# Patient Record
Sex: Female | Born: 1995 | Race: Black or African American | Hispanic: Yes | Marital: Single | Smoking: Never smoker
Health system: Southern US, Community
[De-identification: ages and names within clinical notes are randomized; demographics above are authoritative.]

## PROBLEM LIST (undated history)

## (undated) ENCOUNTER — Inpatient Hospital Stay (HOSPITAL_COMMUNITY): Payer: Self-pay

## (undated) DIAGNOSIS — Z789 Other specified health status: Secondary | ICD-10-CM

## (undated) HISTORY — PX: NO PAST SURGERIES: SHX2092

---

## 2017-01-21 ENCOUNTER — Inpatient Hospital Stay (HOSPITAL_COMMUNITY)
Admission: AD | Admit: 2017-01-21 | Discharge: 2017-01-21 | Disposition: A | Discharge status: Home / Self Care | Payer: Managed Care, Other (non HMO) | Source: Ambulatory Visit | Attending: Obstetrics and Gynecology | Admitting: Obstetrics and Gynecology

## 2017-01-21 ENCOUNTER — Encounter (HOSPITAL_COMMUNITY): Payer: Self-pay | Admitting: *Deleted

## 2017-01-21 DIAGNOSIS — Z3201 Encounter for pregnancy test, result positive: Secondary | ICD-10-CM | POA: Diagnosis not present

## 2017-01-21 DIAGNOSIS — Z3A09 9 weeks gestation of pregnancy: Secondary | ICD-10-CM | POA: Insufficient documentation

## 2017-01-21 DIAGNOSIS — O219 Vomiting of pregnancy, unspecified: Secondary | ICD-10-CM | POA: Diagnosis not present

## 2017-01-21 DIAGNOSIS — Z8249 Family history of ischemic heart disease and other diseases of the circulatory system: Secondary | ICD-10-CM | POA: Diagnosis not present

## 2017-01-21 HISTORY — DX: Other specified health status: Z78.9

## 2017-01-21 LAB — COMPREHENSIVE METABOLIC PANEL
ALBUMIN: 4 g/dL (ref 3.5–5.0)
ALK PHOS: 56 U/L (ref 38–126)
ALT: 19 U/L (ref 14–54)
AST: 21 U/L (ref 15–41)
Anion gap: 8 (ref 5–15)
BILIRUBIN TOTAL: 0.5 mg/dL (ref 0.3–1.2)
BUN: 11 mg/dL (ref 6–20)
CALCIUM: 9.9 mg/dL (ref 8.9–10.3)
CO2: 23 mmol/L (ref 22–32)
CREATININE: 0.44 mg/dL (ref 0.44–1.00)
Chloride: 104 mmol/L (ref 101–111)
GFR calc Af Amer: >60 mL/min (ref >60)
GLUCOSE: 83 mg/dL (ref 65–99)
POTASSIUM: 4.7 mmol/L (ref 3.5–5.1)
Sodium: 135 mmol/L (ref 135–145)
Total Protein: 7.7 g/dL (ref 6.5–8.1)

## 2017-01-21 LAB — CBC
HEMATOCRIT: 37 % (ref 36.0–46.0)
HEMOGLOBIN: 12.8 g/dL (ref 12.0–15.0)
MCH: 30.3 pg (ref 26.0–34.0)
MCHC: 34.6 g/dL (ref 30.0–36.0)
MCV: 87.5 fL (ref 78.0–100.0)
Platelets: 310 10*3/uL (ref 150–400)
RBC: 4.23 MIL/uL (ref 3.87–5.11)
RDW: 14.9 % (ref 11.5–15.5)
WBC: 12.3 10*3/uL — AB (ref 4.0–10.5)

## 2017-01-21 LAB — URINALYSIS, ROUTINE W REFLEX MICROSCOPIC
Bacteria, UA: NONE SEEN
Bilirubin Urine: NEGATIVE
Glucose, UA: NEGATIVE mg/dL
HGB URINE DIPSTICK: NEGATIVE
Ketones, ur: 20 mg/dL — AB
LEUKOCYTES UA: NEGATIVE
NITRITE: NEGATIVE
PH: 5 (ref 5.0–8.0)
Protein, ur: 30 mg/dL — AB
SPECIFIC GRAVITY, URINE: 1.027 (ref 1.005–1.030)

## 2017-01-21 LAB — POCT PREGNANCY, URINE: Preg Test, Ur: POSITIVE — AB

## 2017-01-21 MED ORDER — PROMETHAZINE HCL 25 MG PO TABS: 25.0000 mg | ORAL_TABLET | Freq: Four times a day (QID) | ORAL | 0 refills | Status: DC | PRN

## 2017-01-21 MED ORDER — LACTATED RINGERS IV BOLUS (SEPSIS)
1000.0000 mL | Freq: Once | INTRAVENOUS | Status: AC
  Administered 2017-01-21: 1000 mL via INTRAVENOUS

## 2017-01-21 MED ORDER — PROMETHAZINE HCL 25 MG/ML IJ SOLN
25.0000 mg | Freq: Once | INTRAMUSCULAR | Status: AC
  Administered 2017-01-21: 25 mg via INTRAVENOUS
  Filled 2017-01-21: qty 1

## 2017-01-21 MED ORDER — DOXYLAMINE-PYRIDOXINE 10-10 MG PO TBEC: 2.0000 | DELAYED_RELEASE_TABLET | Freq: Every evening | ORAL | 1 refills | Status: DC | PRN

## 2017-01-21 NOTE — MAU Note (Signed)
Pt states she has been having headaches and n/v on and off, but couldn't keep anything down yesterday.  Pt had been taking Diclegis that was left over from a previous pregnancy, but ran out of it and symptoms have gotten worse since. Denies pain.

## 2017-01-21 NOTE — MAU Provider Note (Signed)
History     CSN: [id number]  Arrival date and time: 01/21/17 1310   First Provider Initiated Contact with Patient 01/21/17 1445     Chief Complaint  Patient presents with  . Emesis  . Nausea   HPI Shelby Kerr is a 22 y.o. G3P0020 at 8036w1d who presents with nausea and vomiting. She states she has had nausea throughout the pregnancy and has been using diclegis from a previous pregnancy to help. She ran out last week and states the nausea and vomiting has gotten worse. Denies leaking or bleeding. Denies pain. She has not been seen anywhere during this pregnancy yet.   OB History    Gravida Para Term Preterm AB Living   3       2     SAB TAB Ectopic Multiple Live Births   1 1            Past Medical History:  Diagnosis Date  . Medical history non-contributory     Past Surgical History:  Procedure Laterality Date  . NO PAST SURGERIES      Family History  Problem Relation Age of Onset  . Hypertension Mother     Social History   Tobacco Use  . Smoking status: Never Smoker  . Smokeless tobacco: Never Used  Substance Use Topics  . Alcohol use: No    Frequency: Never  . Drug use: No    Allergies: No Known Allergies  No medications prior to admission.    Review of Systems  Constitutional: Negative.  Negative for fatigue and fever.  HENT: Negative.   Respiratory: Negative.  Negative for shortness of breath.   Cardiovascular: Negative.  Negative for chest pain.  Gastrointestinal: Positive for nausea and vomiting. Negative for abdominal pain, constipation and diarrhea.  Genitourinary: Negative.  Negative for dysuria, vaginal bleeding and vaginal discharge.  Neurological: Negative.  Negative for dizziness and headaches.   Physical Exam   Blood pressure 122/81, pulse (!) 108, temperature 98.5 F (36.9 C), temperature source Oral, resp. rate 16, height 5\' 3"  (1.6 m), weight 114 lb (51.7 kg), last menstrual period 11/18/2016.  Physical Exam  Nursing note and  vitals reviewed. Constitutional: She is oriented to person, place, and time. She appears well-developed and well-nourished. No distress.  HENT:  Head: Normocephalic.  Eyes: Pupils are equal, round, and reactive to light.  Cardiovascular: Normal rate, regular rhythm and normal heart sounds.  Respiratory: Effort normal and breath sounds normal. No respiratory distress.  GI: Soft. Bowel sounds are normal. She exhibits no distension. There is no tenderness.  Neurological: She is alert and oriented to person, place, and time.  Skin: Skin is warm and dry.  Psychiatric: She has a normal mood and affect. Her behavior is normal. Judgment and thought content normal.    MAU Course  Procedures Results for orders placed or performed during the hospital encounter of 01/21/17 (from the past 24 hour(s))  Urinalysis, Routine w reflex microscopic     Status: Abnormal   Collection Time: 01/21/17  2:00 PM  Result Value Ref Range   Color, Urine AMBER (A) YELLOW   APPearance HAZY (A) CLEAR   Specific Gravity, Urine 1.027 1.005 - 1.030   pH 5.0 5.0 - 8.0   Glucose, UA NEGATIVE NEGATIVE mg/dL   Hgb urine dipstick NEGATIVE NEGATIVE   Bilirubin Urine NEGATIVE NEGATIVE   Ketones, ur 20 (A) NEGATIVE mg/dL   Protein, ur 30 (A) NEGATIVE mg/dL   Nitrite NEGATIVE NEGATIVE   Leukocytes, UA  NEGATIVE NEGATIVE   RBC / HPF 0-5 0 - 5 RBC/hpf   WBC, UA 0-5 0 - 5 WBC/hpf   Bacteria, UA NONE SEEN NONE SEEN   Squamous Epithelial / LPF 6-30 (A) NONE SEEN   Mucus PRESENT   Pregnancy, urine POC     Status: Abnormal   Collection Time: 01/21/17  2:08 PM  Result Value Ref Range   Preg Test, Ur POSITIVE (A) NEGATIVE   MDM UA IV bolus CBC, CMP Phenergan IV No episodes of vomiting while in MAU, patient reports relief from nausea  Assessment and Plan   1. Nausea and vomiting during pregnancy prior to [redacted] weeks gestation    -Discharge home in stable condition -Rx for phenergan and diclegis sent to patient's  pharmacy -Patient advised to follow-up with OB/GYN of choice to start prenatal care asap -Patient may return to MAU as needed or if her condition were to change or worsen   Rolm Bookbinder CNM 01/21/2017, 2:48 PM

## 2017-01-21 NOTE — Discharge Instructions (Signed)
Morning Sickness °Morning sickness is when you feel sick to your stomach (nauseous) during pregnancy. This nauseous feeling may or may not come with vomiting. It often occurs in the morning but can be a problem any time of day. Morning sickness is most common during the first trimester, but it may continue throughout pregnancy. While morning sickness is unpleasant, it is usually harmless unless you develop severe and continual vomiting (hyperemesis gravidarum). This condition requires more intense treatment. °What are the causes? °The cause of morning sickness is not completely known but seems to be related to normal hormonal changes that occur in pregnancy. °What increases the risk? °You are at greater risk if you: °· Experienced nausea or vomiting before your pregnancy. °· Had morning sickness during a previous pregnancy. °· Are pregnant with more than one baby, such as twins. ° °How is this treated? °Do not use any medicines (prescription, over-the-counter, or herbal) for morning sickness without first talking to your health care provider. Your health care provider may prescribe or recommend: °· Vitamin B6 supplements. °· Anti-nausea medicines. °· The herbal medicine ginger. ° °Follow these instructions at home: °· Only take over-the-counter or prescription medicines as directed by your health care provider. °· Taking multivitamins before getting pregnant can prevent or decrease the severity of morning sickness in most women. °· Eat a piece of dry toast or unsalted crackers before getting out of bed in the morning. °· Eat five or six small meals a day. °· Eat dry and bland foods (rice, baked potato). Foods high in carbohydrates are often helpful. °· Do not drink liquids with your meals. Drink liquids between meals. °· Avoid greasy, fatty, and spicy foods. °· Get someone to cook for you if the smell of any food causes nausea and vomiting. °· If you feel nauseous after taking prenatal vitamins, take the vitamins at  night or with a snack. °· Snack on protein foods (nuts, yogurt, cheese) between meals if you are hungry. °· Eat unsweetened gelatins for desserts. °· Wearing an acupressure wristband (worn for sea sickness) may be helpful. °· Acupuncture may be helpful. °· Do not smoke. °· Get a humidifier to keep the air in your house free of odors. °· Get plenty of fresh air. °Contact a health care provider if: °· Your home remedies are not working, and you need medicine. °· You feel dizzy or lightheaded. °· You are losing weight. °Get help right away if: °· You have persistent and uncontrolled nausea and vomiting. °· You pass out (faint). °This information is not intended to replace advice given to you by your health care provider. Make sure you discuss any questions you have with your health care provider. °Document Released: 02/08/2006 Document Revised: 05/26/2015 Document Reviewed: 06/04/2012 °Elsevier Interactive Patient Education © 2017 [employer] °Safe Medications in Pregnancy  ° °Acne: °Benzoyl Peroxide °Salicylic Acid ° °Backache/Headache: °Tylenol: 2 regular strength every 4 hours OR °             2 Extra strength every 6 hours ° °Colds/Coughs/Allergies: °Benadryl (alcohol free) 25 mg every 6 hours as needed °Breath right strips °Claritin °Cepacol throat lozenges °Chloraseptic throat spray °Cold-Eeze- up to three times per day °Cough drops, alcohol free °Flonase (by prescription only) °Guaifenesin °Mucinex °Robitussin DM (plain only, alcohol free) °Saline nasal spray/drops °Sudafed (pseudoephedrine) & Actifed ** use only after [redacted] weeks gestation and if you do not have high blood pressure °Tylenol °Vicks Vaporub °Zinc lozenges °Zyrtec  ° °Constipation: °Colace °Ducolax suppositories °Fleet enema °  Glycerin suppositories °Metamucil °Milk of magnesia °Miralax °Senokot °Smooth move tea ° °Diarrhea: °Kaopectate °Imodium A-D ° °*NO pepto Bismol ° °Hemorrhoids: °Anusol °Anusol HC °Preparation  H °Tucks ° °Indigestion: °Tums °Maalox °Mylanta °Zantac  °Pepcid ° °Insomnia: °Benadryl (alcohol free) 25mg every 6 hours as needed °Tylenol PM °Unisom, no Gelcaps ° °Leg Cramps: °Tums °MagGel ° °Nausea/Vomiting:  °Bonine °Dramamine °Emetrol °Ginger extract °Sea bands °Meclizine  °Nausea medication to take during pregnancy:  °Unisom (doxylamine succinate 25 mg tablets) Take one tablet daily at bedtime. If symptoms are not adequately controlled, the dose can be increased to a maximum recommended dose of two tablets daily (1/2 tablet in the morning, 1/2 tablet mid-afternoon and one at bedtime). °Vitamin B6 100mg tablets. Take one tablet twice a day (up to 200 mg per day). ° °Skin Rashes: °Aveeno products °Benadryl cream or 25mg every 6 hours as needed °Calamine Lotion °1% cortisone cream ° °Yeast infection: °Gyne-lotrimin 7 °Monistat 7 ° ° °**If taking multiple medications, please check labels to avoid duplicating the same active ingredients °**take medication as directed on the label °** Do not exceed 4000 mg of tylenol in 24 hours °**Do not take medications that contain aspirin or ibuprofen ° ° ° ° °

## 2017-08-19 ENCOUNTER — Inpatient Hospital Stay (HOSPITAL_COMMUNITY): Payer: Managed Care, Other (non HMO) | Admitting: Anesthesiology

## 2017-08-19 ENCOUNTER — Other Ambulatory Visit: Payer: Self-pay

## 2017-08-19 ENCOUNTER — Inpatient Hospital Stay (HOSPITAL_COMMUNITY)
Admission: AD | Admit: 2017-08-19 | Discharge: 2017-08-21 | DRG: 806 | Disposition: A | Discharge status: Home / Self Care | Payer: Managed Care, Other (non HMO) | Attending: Obstetrics and Gynecology | Admitting: Obstetrics and Gynecology

## 2017-08-19 ENCOUNTER — Encounter (HOSPITAL_COMMUNITY): Payer: Self-pay | Admitting: *Deleted

## 2017-08-19 DIAGNOSIS — Z3A39 39 weeks gestation of pregnancy: Secondary | ICD-10-CM | POA: Diagnosis not present

## 2017-08-19 DIAGNOSIS — O99824 Streptococcus B carrier state complicating childbirth: Secondary | ICD-10-CM | POA: Diagnosis present

## 2017-08-19 DIAGNOSIS — Z3483 Encounter for supervision of other normal pregnancy, third trimester: Secondary | ICD-10-CM | POA: Diagnosis present

## 2017-08-19 LAB — RPR: RPR Ser Ql: NONREACTIVE

## 2017-08-19 LAB — CBC
HEMATOCRIT: 36.5 % (ref 36.0–46.0)
HEMOGLOBIN: 12 g/dL (ref 12.0–15.0)
MCH: 27.5 pg (ref 26.0–34.0)
MCHC: 32.9 g/dL (ref 30.0–36.0)
MCV: 83.5 fL (ref 78.0–100.0)
PLATELETS: 192 10*3/uL (ref 150–400)
RBC: 4.37 MIL/uL (ref 3.87–5.11)
RDW: 16.1 % — ABNORMAL HIGH (ref 11.5–15.5)
WBC: 16.7 10*3/uL — AB (ref 4.0–10.5)

## 2017-08-19 LAB — TYPE AND SCREEN
ABO/RH(D): O POS
ANTIBODY SCREEN: NEGATIVE

## 2017-08-19 LAB — ABO/RH: ABO/RH(D): O POS

## 2017-08-19 MED ORDER — SIMETHICONE 80 MG PO CHEW: 80.0000 mg | CHEWABLE_TABLET | ORAL | Status: DC | PRN

## 2017-08-19 MED ORDER — OXYTOCIN 40 UNITS IN LACTATED RINGERS INFUSION - SIMPLE MED: 2.5000 [IU]/h | INTRAVENOUS | Status: DC

## 2017-08-19 MED ORDER — PHENYLEPHRINE 40 MCG/ML (10ML) SYRINGE FOR IV PUSH (FOR BLOOD PRESSURE SUPPORT)
80.0000 ug | PREFILLED_SYRINGE | INTRAVENOUS | Status: DC | PRN
  Filled 2017-08-19: qty 5
  Filled 2017-08-19: qty 10

## 2017-08-19 MED ORDER — ONDANSETRON HCL 4 MG/2ML IJ SOLN: 4.0000 mg | INTRAMUSCULAR | Status: DC | PRN

## 2017-08-19 MED ORDER — LACTATED RINGERS IV SOLN
500.0000 mL | Freq: Once | INTRAVENOUS | Status: AC
  Administered 2017-08-19: 500 mL via INTRAVENOUS

## 2017-08-19 MED ORDER — LIDOCAINE HCL (PF) 1 % IJ SOLN
INTRAMUSCULAR | Status: DC | PRN
  Administered 2017-08-19: 8 mL via EPIDURAL

## 2017-08-19 MED ORDER — SOD CITRATE-CITRIC ACID 500-334 MG/5ML PO SOLN: 30.0000 mL | ORAL | Status: DC | PRN

## 2017-08-19 MED ORDER — OXYTOCIN BOLUS FROM INFUSION
500.0000 mL | Freq: Once | INTRAVENOUS | Status: AC
  Administered 2017-08-19: 500 mL via INTRAVENOUS

## 2017-08-19 MED ORDER — SODIUM CHLORIDE 0.9 % IV SOLN
5.0000 10*6.[IU] | Freq: Once | INTRAVENOUS | Status: AC
  Administered 2017-08-19: 5 10*6.[IU] via INTRAVENOUS
  Filled 2017-08-19: qty 5

## 2017-08-19 MED ORDER — DIPHENHYDRAMINE HCL 50 MG/ML IJ SOLN: 12.5000 mg | INTRAMUSCULAR | Status: DC | PRN

## 2017-08-19 MED ORDER — ZOLPIDEM TARTRATE 5 MG PO TABS
5.0000 mg | ORAL_TABLET | Freq: Every evening | ORAL | Status: DC | PRN
Start: 2017-08-19 — End: 2017-08-21

## 2017-08-19 MED ORDER — ACETAMINOPHEN 325 MG PO TABS
650.0000 mg | ORAL_TABLET | ORAL | Status: DC | PRN
  Administered 2017-08-19: 650 mg via ORAL
  Filled 2017-08-19: qty 2

## 2017-08-19 MED ORDER — SENNOSIDES-DOCUSATE SODIUM 8.6-50 MG PO TABS
2.0000 | ORAL_TABLET | ORAL | Status: DC
  Administered 2017-08-20 (×2): 2 via ORAL
  Filled 2017-08-19 (×2): qty 2

## 2017-08-19 MED ORDER — DIBUCAINE 1 % RE OINT: 1.0000 "application " | TOPICAL_OINTMENT | RECTAL | Status: DC | PRN

## 2017-08-19 MED ORDER — EPHEDRINE 5 MG/ML INJ
10.0000 mg | INTRAVENOUS | Status: DC | PRN
  Filled 2017-08-19: qty 2

## 2017-08-19 MED ORDER — LACTATED RINGERS IV SOLN: 500.0000 mL | INTRAVENOUS | Status: DC | PRN

## 2017-08-19 MED ORDER — LACTATED RINGERS IV SOLN
INTRAVENOUS | Status: DC
  Administered 2017-08-19 (×2): via INTRAVENOUS

## 2017-08-19 MED ORDER — DIPHENHYDRAMINE HCL 25 MG PO CAPS: 25.0000 mg | ORAL_CAPSULE | Freq: Four times a day (QID) | ORAL | Status: DC | PRN

## 2017-08-19 MED ORDER — OXYCODONE HCL 5 MG PO TABS: 5.0000 mg | ORAL_TABLET | ORAL | Status: DC | PRN

## 2017-08-19 MED ORDER — TETANUS-DIPHTH-ACELL PERTUSSIS 5-2.5-18.5 LF-MCG/0.5 IM SUSP: 0.5000 mL | Freq: Once | INTRAMUSCULAR | Status: DC

## 2017-08-19 MED ORDER — ACETAMINOPHEN 325 MG PO TABS: 650.0000 mg | ORAL_TABLET | ORAL | Status: DC | PRN

## 2017-08-19 MED ORDER — OXYTOCIN 40 UNITS IN LACTATED RINGERS INFUSION - SIMPLE MED
1.0000 m[IU]/min | INTRAVENOUS | Status: DC
  Administered 2017-08-19: 2 m[IU]/min via INTRAVENOUS
  Filled 2017-08-19: qty 1000

## 2017-08-19 MED ORDER — FENTANYL 2.5 MCG/ML BUPIVACAINE 1/10 % EPIDURAL INFUSION (WH - ANES)
14.0000 mL/h | INTRAMUSCULAR | Status: DC | PRN
  Administered 2017-08-19 (×2): 14 mL/h via EPIDURAL
  Filled 2017-08-19 (×2): qty 100

## 2017-08-19 MED ORDER — OXYCODONE HCL 5 MG PO TABS: 10.0000 mg | ORAL_TABLET | ORAL | Status: DC | PRN

## 2017-08-19 MED ORDER — OXYCODONE-ACETAMINOPHEN 5-325 MG PO TABS: 1.0000 | ORAL_TABLET | ORAL | Status: DC | PRN

## 2017-08-19 MED ORDER — PENICILLIN G 3 MILLION UNITS IVPB - SIMPLE MED
3.0000 10*6.[IU] | INTRAVENOUS | Status: DC
  Administered 2017-08-19 (×2): 3 10*6.[IU] via INTRAVENOUS
  Filled 2017-08-19 (×2): qty 3
  Filled 2017-08-19: qty 100

## 2017-08-19 MED ORDER — LIDOCAINE HCL (PF) 1 % IJ SOLN
30.0000 mL | INTRAMUSCULAR | Status: DC | PRN
  Filled 2017-08-19: qty 30

## 2017-08-19 MED ORDER — ONDANSETRON HCL 4 MG PO TABS
4.0000 mg | ORAL_TABLET | ORAL | Status: DC | PRN
Start: 2017-08-19 — End: 2017-08-21

## 2017-08-19 MED ORDER — BENZOCAINE-MENTHOL 20-0.5 % EX AERO
1.0000 "application " | INHALATION_SPRAY | CUTANEOUS | Status: DC | PRN
  Administered 2017-08-19: 1 via TOPICAL
  Filled 2017-08-19: qty 56

## 2017-08-19 MED ORDER — PHENYLEPHRINE 40 MCG/ML (10ML) SYRINGE FOR IV PUSH (FOR BLOOD PRESSURE SUPPORT)
80.0000 ug | PREFILLED_SYRINGE | INTRAVENOUS | Status: DC | PRN
  Filled 2017-08-19: qty 5

## 2017-08-19 MED ORDER — WITCH HAZEL-GLYCERIN EX PADS: 1.0000 "application " | MEDICATED_PAD | CUTANEOUS | Status: DC | PRN

## 2017-08-19 MED ORDER — COCONUT OIL OIL
1.0000 "application " | TOPICAL_OIL | Status: DC | PRN
  Administered 2017-08-20: 1 via TOPICAL
  Filled 2017-08-19: qty 120

## 2017-08-19 MED ORDER — IBUPROFEN 600 MG PO TABS
600.0000 mg | ORAL_TABLET | Freq: Four times a day (QID) | ORAL | Status: DC
  Administered 2017-08-19 – 2017-08-21 (×8): 600 mg via ORAL
  Filled 2017-08-19 (×7): qty 1

## 2017-08-19 MED ORDER — FLEET ENEMA 7-19 GM/118ML RE ENEM: 1.0000 | ENEMA | RECTAL | Status: DC | PRN

## 2017-08-19 MED ORDER — OXYCODONE-ACETAMINOPHEN 5-325 MG PO TABS: 2.0000 | ORAL_TABLET | ORAL | Status: DC | PRN

## 2017-08-19 MED ORDER — PRENATAL MULTIVITAMIN CH
1.0000 | ORAL_TABLET | Freq: Every day | ORAL | Status: DC
  Administered 2017-08-20 – 2017-08-21 (×2): 1 via ORAL
  Filled 2017-08-19 (×2): qty 1

## 2017-08-19 MED ORDER — ONDANSETRON HCL 4 MG/2ML IJ SOLN
4.0000 mg | Freq: Four times a day (QID) | INTRAMUSCULAR | Status: DC | PRN
  Administered 2017-08-19: 4 mg via INTRAVENOUS
  Filled 2017-08-19: qty 2

## 2017-08-19 MED ORDER — TERBUTALINE SULFATE 1 MG/ML IJ SOLN
0.2500 mg | Freq: Once | INTRAMUSCULAR | Status: DC | PRN
  Filled 2017-08-19: qty 1

## 2017-08-19 NOTE — Anesthesia Postprocedure Evaluation (Signed)
Anesthesia Post Note  Patient: Suzie Leugers  Procedure(s) Performed: AN AD HOC LABOR EPIDURAL     Patient location during evaluation: Mother Baby Anesthesia Type: Epidural Level of consciousness: awake and alert Pain management: pain level controlled Vital Signs Assessment: post-procedure vital signs reviewed and stable Respiratory status: spontaneous breathing, nonlabored ventilation and respiratory function stable Cardiovascular status: stable Postop Assessment: no headache, no backache, epidural receding, no apparent nausea or vomiting, patient able to bend at knees, adequate PO intake and able to ambulate Anesthetic complications: no    Last Vitals:  Vitals:   08/19/17 1201 08/19/17 1234  BP: 102/70 118/72  Pulse: 76 74  Resp: 16 20  Temp:  36.9 C  SpO2:      Last Pain:  Vitals:   08/19/17 1235  TempSrc:   PainSc: 0-No pain   Pain Goal: Patients Stated Pain Goal: 0 (08/19/17 0400)               Kyen Taite Hristova

## 2017-08-19 NOTE — MAU Note (Signed)
Pt reports UC's less than 5 minutes apart since 2300. Some bleeding after intercourse today.

## 2017-08-19 NOTE — Anesthesia Procedure Notes (Signed)
Epidural Patient location during procedure: OB Start time: 08/19/2017 2:00 AM End time: 08/19/2017 2:10 AM  Staffing Anesthesiologist: Bethena Midgetddono, Ellenie Salome, MD  Preanesthetic Checklist Completed: patient identified, site marked, surgical consent, pre-op evaluation, timeout performed, IV checked, risks and benefits discussed and monitors and equipment checked  Epidural Patient position: sitting Prep: site prepped and draped and DuraPrep Patient monitoring: continuous pulse ox and blood pressure Approach: midline Location: L4-L5 Injection technique: LOR air  Needle:  Needle type: Tuohy  Needle gauge: 17 G Needle length: 9 cm and 9 Needle insertion depth: 6 cm Catheter type: closed end flexible Catheter size: 19 Gauge Catheter at skin depth: 11 cm Test dose: negative  Assessment Events: blood not aspirated, injection not painful, no injection resistance, negative IV test and no paresthesia

## 2017-08-19 NOTE — H&P (Signed)
Shelby Kerr is a 22 y.o. female presenting for contractions. OB History    Gravida  3   Para      Term      Preterm      AB  2   Living        SAB  1   TAB  1   Ectopic      Multiple      Live Births             Past Medical History:  Diagnosis Date  . Medical history non-contributory    Past Surgical History:  Procedure Laterality Date  . NO PAST SURGERIES     Family History: family history includes Hypertension in her mother. Social History:  reports that she has never smoked. She has never used smokeless tobacco. She reports that she does not drink alcohol or use drugs.     Maternal Diabetes: No Genetic Screening: Normal Maternal Ultrasounds/Referrals: Normal Fetal Ultrasounds or other Referrals:  None Maternal Substance Abuse:  No Significant Maternal Medications:  None Significant Maternal Lab Results:  None Other Comments:  None  Review of Systems  Eyes: Negative for blurred vision.  Gastrointestinal: Negative for abdominal pain.  Neurological: Negative for headaches.   Maternal Medical History:  Reason for admission: Contractions.   Contractions: Onset was 6-12 hours ago.    Fetal activity: Perceived fetal activity is normal.      Dilation: 6.5 Effacement (%): 90 Station: -2 Exam by:: L.Stubbs, RN Blood pressure 105/65, pulse 65, temperature 98.1 F (36.7 C), temperature source Oral, resp. rate 18, height 5\' 3"  (1.6 m), weight 64.9 kg, last menstrual period 11/18/2016, SpO2 99 %. Maternal Exam:  Abdomen: Fetal presentation: vertex     Fetal Exam Fetal State Assessment: Category I - tracings are normal.     Physical Exam  Cardiovascular: Normal rate.  Respiratory: Effort normal.  GI: Soft.  Neurological: She has normal reflexes.    9/C/-2/vtx AROM clear  Prenatal labs: ABO, Rh: --/--/O POS, O POS Performed at [facility], [address] [place], [place] [address]  [phone](08/19 [id number]) Antibody: NEG (08/19  0115) Rubella:   RPR:    HBsAg:    HIV:    GBS:   positive  Assessment/Plan: 22 yo G3P0 in active labor ATB per [facility] protocol   Shelby Kerr 08/19/2017, 7:48 AM

## 2017-08-19 NOTE — Progress Notes (Signed)
Delivery Note At 10:44 AM a viable female was delivered via Vaginal, Spontaneous (Presentation:LOA ;  ).  APGAR: 8, 9; weight  .   Placenta status:intact , .  Cord: 3 velles with the following complications:loose nuchal cord x 1>reduced .  Cord pH: not done  Anesthesia:   Episiotomy: None Lacerations: 2nd degree;Periurethral;Perineal Suture Repair: 3.0 vicryl rapide Est. Blood Loss (mL):   Small hematoma @ left periurethral lac>controlled with suture. No involvement of urethral meatus Mom to postpartum.  Baby to Couplet care / Skin to Skin.  Shelby LocusJames E Zayan Delvecchio II 08/19/2017, 10:58 AM

## 2017-08-19 NOTE — Anesthesia Preprocedure Evaluation (Signed)

## 2017-08-19 NOTE — Anesthesia Pain Management Evaluation Note (Signed)
  CRNA Pain Management Visit Note  Patient: Shelby Kerr, 22 y.o., female  "Hello I am a member of the anesthesia team at [facility]. We have an anesthesia team available at all times to provide care throughout the hospital, including epidural management and anesthesia for C-section. I don't know your plan for the delivery whether it a natural birth, water birth, IV sedation, nitrous supplementation, doula or epidural, but we want to meet your pain goals."   1.Was your pain managed to your expectations on prior hospitalizations?   Yes   2.What is your expectation for pain management during this hospitalization?     Epidural  3.How can we help you reach that goal? epidural  Record the patient's initial score and the patient's pain goal.   Pain: 7/10  Pain Goal: 0/10 [facility] wants you to be able to say your pain was always managed very well.  Salome ArntSterling, Charna Neeb Marie 08/19/2017

## 2017-08-19 NOTE — Lactation Note (Signed)
This note was copied from a baby's chart. Lactation Consultation Note  Patient Name: [name] UJWJX'BToday's Date: 08/19/2017 Reason for consult: Initial assessment;Primapara;1st time breastfeeding;Term  P1 mother whose infant is now 595 hours old  RN in room doing newborn assessment and baby remained quiet and alert after assessment.  I offered to assist with latching and mother accepted.  Mother's breasts are soft and non tender with everted nipples.  Taught breast massage and hand expression with return demonstration by mother.  She was able to express a few drops of colostrum which was fed back to baby.  Colostrum container provided for any EBM she may obtain during hand expression.  Assisted baby to latch in the football hold on the left breast without difficulty.  Baby had wide open mouth and flanged lips at the breast.  Rhythmic sucking noted and a few audible swallows were heard.  Mother felt a tugging but no pain.  Encouraged mother to feed 8-12 times/24 hours or sooner if baby shows feeding cues.  Reviewed feeding cues.  Continue lots of STS, breast massage and hand expression before/after feeding.    Mom made aware of O/P services, breastfeeding support groups, community resources, and our phone # for post-discharge questions. Father present and sleeping on the couch.  Mother will call for latch assistance as needed.     Maternal Data Formula Feeding for Exclusion: No Has patient been taught Hand Expression?: Yes Does the patient have breastfeeding experience prior to this delivery?: No  Feeding Feeding Type: Breast Fed Length of feed: 15 min(still feeding as I left the room)  LATCH Score Latch: Grasps breast easily, tongue down, lips flanged, rhythmical sucking.  Audible Swallowing: A few with stimulation  Type of Nipple: Everted at rest and after stimulation  Comfort (Breast/Nipple): Soft / non-tender  Hold (Positioning): Assistance needed to correctly position  infant at breast and maintain latch.  LATCH Score: 8  Interventions Interventions: Breast feeding basics reviewed;Assisted with latch;Skin to skin;Breast massage;Hand express;Position options;Support pillows;Adjust position;Breast compression  Lactation Tools Discussed/Used     Consult Status Consult Status: Follow-up Date: 08/20/17 Follow-up type: In-patient    Dora SimsBeth R Litzi Binning 08/19/2017, 4:33 PM

## 2017-08-20 LAB — CBC
HCT: 29.2 % — ABNORMAL LOW (ref 36.0–46.0)
Hemoglobin: 9.6 g/dL — ABNORMAL LOW (ref 12.0–15.0)
MCH: 27.4 pg (ref 26.0–34.0)
MCHC: 32.9 g/dL (ref 30.0–36.0)
MCV: 83.2 fL (ref 78.0–100.0)
PLATELETS: 165 10*3/uL (ref 150–400)
RBC: 3.51 MIL/uL — AB (ref 3.87–5.11)
RDW: 15.9 % — ABNORMAL HIGH (ref 11.5–15.5)
WBC: 14.3 10*3/uL — AB (ref 4.0–10.5)

## 2017-08-20 NOTE — Progress Notes (Signed)
Post Partum Day 1 Subjective: no complaints  Objective: Blood pressure (!) 105/54, pulse 76, temperature 98.8 F (37.1 C), temperature source Oral, resp. rate 16, height 5\' 3"  (1.6 m), weight 64.9 kg, last menstrual period 11/18/2016, SpO2 100 %, unknown if currently breastfeeding.  Physical Exam:  General: alert and cooperative Lochia: appropriate Uterine Fundus: firm Incision: n/a DVT Evaluation: No evidence of DVT seen on physical exam.  Recent Labs    08/19/17 0115 08/20/17 0553  HGB 12.0 9.6*  HCT 36.5 29.2*    Assessment/Plan: Plan for discharge tomorrow   LOS: 1 day   Shelby Kerr 08/20/2017, 8:49 AM

## 2017-08-21 NOTE — Lactation Note (Signed)
This note was copied from a baby's chart. Lactation Consultation Note  Patient Name: Girl Irven EasterlyYahmilette Ruffolo NFAOZ'HToday's Date: 08/21/2017 Reason for consult: Follow-up assessment Mom states she is still feeling some discomfort with feedings.  She feels baby slips to nipple.  Reviewed importance of holding baby in close during feeding.  Comfort gels given with instructions.  Discussed milk coming to volume and prevention and treatment of engorgement.  Mom has a DEBP at home.  Lactation outpatient services and support information reviewed and encouraged prn.  Maternal Data    Feeding Feeding Type: Breast Fed Length of feed: 20 min  LATCH Score                   Interventions    Lactation Tools Discussed/Used     Consult Status Consult Status: Complete Follow-up type: Call as needed    Huston FoleyMOULDEN, Zekiah Caruth S 08/21/2017, 9:16 AM

## 2017-08-21 NOTE — Lactation Note (Signed)
This note was copied from a baby's chart. Lactation Consultation Note  Patient Name: Girl Irven EasterlyYahmilette Gacek ZOXWR'UToday's Date: 08/21/2017 Reason for consult: Follow-up assessment;Nipple pain/trauma Breasts are filling.  Small abrasions healing on tips of nipple.  Observed mom independently latch baby to breast.  Gentle chin tug to bring out lower lip.  Swallows identified.  Questions answered.  Maternal Data    Feeding Feeding Type: Breast Fed Length of feed: 20 min  LATCH Score Latch: Grasps breast easily, tongue down, lips flanged, rhythmical sucking.  Audible Swallowing: Spontaneous and intermittent  Type of Nipple: Everted at rest and after stimulation  Comfort (Breast/Nipple): Filling, red/small blisters or bruises, mild/mod discomfort  Hold (Positioning): No assistance needed to correctly position infant at breast.  LATCH Score: 9  Interventions    Lactation Tools Discussed/Used Tools: Comfort gels   Consult Status Consult Status: Complete Follow-up type: Call as needed    Huston FoleyMOULDEN, Eilan Mcinerny S 08/21/2017, 9:34 AM

## 2017-08-21 NOTE — Lactation Note (Signed)
This note was copied from a baby's chart. Lactation Consultation Note  Patient Name: Shelby Kerr ZOXWR'UToday's Date: 08/21/2017 Reason for consult: Follow-up assessment;Mother's request;Difficult latch;Term P1, 6138 hours old female  Per mom, sore nipples and pain w/ latching. LC assisted mom, BF on left breast using the cross- cradle position, LC discussed importance infant mouth wide w/ gape, tongue down and lower jaw extended. Infant latched and audible swallowing heard was heard by [facility] and mom. Latch -8. Infant still  feeding as LC left room (10 minutes ).  Per mom,  she is not t feeling  any pain only a tug at first . Discussed w/ mom infant's mouth should be wide like "biting into an apple". If mom experiences pain to break infant's latch and re-latch infant to breast again. Look to see if infant's tongue is down and mouth is wide when latching to breast. Mom will continue to work w/ latching infant to breast.  Mom will continue to feed infant according to cuing, 8 to 12 times within 24 hours including nights. Parent will call LC if they have any further questions or concerns.  Maternal Data Formula Feeding for Exclusion: No  Feeding Feeding Type: Breast Fed Length of feed: 25 min  LATCH Score Latch: Grasps breast easily, tongue down, lips flanged, rhythmical sucking.  Audible Swallowing: Spontaneous and intermittent  Type of Nipple: Everted at rest and after stimulation  Comfort (Breast/Nipple): Filling, red/small blisters or bruises, mild/mod discomfort  Hold (Positioning): Assistance needed to correctly position infant at breast and maintain latch.  LATCH Score: 8  Interventions Interventions: Skin to skin;Assisted with latch;Position options;Support pillows;Adjust position;Breast compression  Lactation Tools Discussed/Used     Consult Status Consult Status: Follow-up Date: 08/22/17 Follow-up type: In-patient    Danelle EarthlyRobin Rennae Ferraiolo 08/21/2017, 1:23 AM

## 2017-08-21 NOTE — Discharge Summary (Signed)
Obstetric Discharge Summary Reason for Admission: onset of labor Prenatal Procedures: none Intrapartum Procedures: spontaneous vaginal delivery Postpartum Procedures: none Complications-Operative and Postpartum: none Hemoglobin  Date Value Ref Range Status  08/20/2017 9.6 (L) 12.0 - 15.0 g/dL Final   HCT  Date Value Ref Range Status  08/20/2017 29.2 (L) 36.0 - 46.0 % Final    Physical Exam:  General: alert Lochia: appropriate Uterine Fundus: firm Incision: healing well DVT Evaluation: No evidence of DVT seen on physical exam.  Discharge Diagnoses: Term Pregnancy-delivered  Discharge Information: Date: 08/21/2017 Activity: pelvic rest Diet: routine Medications: PNV and Ibuprofen Condition: stable Instructions: refer to practice specific booklet Discharge to: home Follow-up Information    [place] Schedule an appointment as soon as possible for a visit in 6 week(s).   Contact information: [address] [place] [address] [phone]           Newborn Data: Live born female  Birth Weight: 6 lb 7.2 oz (2925 g) APGAR: 8, 9  Newborn Delivery   Birth date/time:  08/19/2017 10:44:00 Delivery type:  Vaginal, Spontaneous     Home with mother.  Shelby Kerr Milana ObeyM Darris Carachure 08/21/2017, 8:31 AM

## 2019-01-02 NOTE — L&D Delivery Note (Signed)
DELIVERY NOTE  I was called to patient room as patient's provider had been notified of impending delivery but was away.  Pt complete and at +4 station with urge to push. At the time, FHR decel into 80s bpm. Epidural controlling pain. Pt pushed and delivered a viable female infant in ROA position. Anterior and posterior shoulders spontaneously delivered with next two pushes; body easily followed next. Infant placed on mothers abdomen and bulb suction of mouth and nose performed. Cord was then clamped and cut by MGM. Cord blood obtained, 3VC. Baby had a vigorous spontaneous cry noted. Placenta then delivered at 1818 intact. Fundal massage performed and pitocin per protocol. Fundus firm. The following lacerations were noted: NONE. Mother and baby stable. Counts correct   Infant time: 85 Gender: female Placenta time: 1818 Apgars: 9/9 Weight: pending skin-to-skin EBL 300cc  Dr Lorane Gell presented to room immediately following placental delivery

## 2019-03-03 LAB — OB RESULTS CONSOLE ANTIBODY SCREEN: Antibody Screen: NEGATIVE

## 2019-03-03 LAB — OB RESULTS CONSOLE RPR: RPR: NONREACTIVE

## 2019-03-03 LAB — OB RESULTS CONSOLE ABO/RH: RH Type: POSITIVE

## 2019-03-03 LAB — OB RESULTS CONSOLE GC/CHLAMYDIA
Chlamydia: NEGATIVE
Gonorrhea: NEGATIVE

## 2019-03-03 LAB — OB RESULTS CONSOLE RUBELLA ANTIBODY, IGM: Rubella: IMMUNE

## 2019-03-03 LAB — OB RESULTS CONSOLE HIV ANTIBODY (ROUTINE TESTING): HIV: NONREACTIVE

## 2019-03-03 LAB — OB RESULTS CONSOLE HEPATITIS B SURFACE ANTIGEN: Hepatitis B Surface Ag: NEGATIVE

## 2019-09-03 LAB — OB RESULTS CONSOLE GBS: GBS: POSITIVE

## 2019-10-02 ENCOUNTER — Telehealth (HOSPITAL_COMMUNITY): Payer: Self-pay | Admitting: *Deleted

## 2019-10-02 ENCOUNTER — Encounter (HOSPITAL_COMMUNITY): Payer: Self-pay | Admitting: *Deleted

## 2019-10-02 NOTE — Telephone Encounter (Signed)
Preadmission screen  

## 2019-10-05 ENCOUNTER — Other Ambulatory Visit (HOSPITAL_COMMUNITY)
Admission: RE | Admit: 2019-10-05 | Discharge: 2019-10-05 | Disposition: A | Discharge status: Home / Self Care | Payer: Managed Care, Other (non HMO) | Source: Ambulatory Visit | Attending: Obstetrics and Gynecology | Admitting: Obstetrics and Gynecology

## 2019-10-05 ENCOUNTER — Telehealth (HOSPITAL_COMMUNITY): Payer: Self-pay | Admitting: *Deleted

## 2019-10-05 DIAGNOSIS — Z20822 Contact with and (suspected) exposure to covid-19: Secondary | ICD-10-CM | POA: Insufficient documentation

## 2019-10-05 DIAGNOSIS — Z01812 Encounter for preprocedural laboratory examination: Secondary | ICD-10-CM | POA: Insufficient documentation

## 2019-10-05 LAB — SARS CORONAVIRUS 2 (TAT 6-24 HRS): SARS Coronavirus 2: NEGATIVE

## 2019-10-05 NOTE — Telephone Encounter (Signed)
Preadmission screen  

## 2019-10-06 ENCOUNTER — Inpatient Hospital Stay (HOSPITAL_COMMUNITY)
Admission: RE | Admit: 2019-10-06 | Discharge: 2019-10-08 | DRG: 807 | Disposition: A | Discharge status: Home / Self Care | Payer: Managed Care, Other (non HMO) | Attending: Obstetrics and Gynecology | Admitting: Obstetrics and Gynecology

## 2019-10-06 ENCOUNTER — Inpatient Hospital Stay (HOSPITAL_COMMUNITY): Payer: Managed Care, Other (non HMO)

## 2019-10-06 ENCOUNTER — Encounter (HOSPITAL_COMMUNITY): Payer: Self-pay | Admitting: Obstetrics and Gynecology

## 2019-10-06 ENCOUNTER — Other Ambulatory Visit: Payer: Self-pay

## 2019-10-06 ENCOUNTER — Inpatient Hospital Stay (HOSPITAL_COMMUNITY): Payer: Managed Care, Other (non HMO) | Admitting: Anesthesiology

## 2019-10-06 DIAGNOSIS — Z20822 Contact with and (suspected) exposure to covid-19: Secondary | ICD-10-CM | POA: Diagnosis present

## 2019-10-06 DIAGNOSIS — O99824 Streptococcus B carrier state complicating childbirth: Secondary | ICD-10-CM | POA: Diagnosis present

## 2019-10-06 DIAGNOSIS — O26893 Other specified pregnancy related conditions, third trimester: Secondary | ICD-10-CM | POA: Diagnosis present

## 2019-10-06 DIAGNOSIS — O480 Post-term pregnancy: Secondary | ICD-10-CM | POA: Diagnosis present

## 2019-10-06 DIAGNOSIS — Z3490 Encounter for supervision of normal pregnancy, unspecified, unspecified trimester: Secondary | ICD-10-CM

## 2019-10-06 DIAGNOSIS — Z3A40 40 weeks gestation of pregnancy: Secondary | ICD-10-CM | POA: Diagnosis not present

## 2019-10-06 DIAGNOSIS — Z01812 Encounter for preprocedural laboratory examination: Secondary | ICD-10-CM | POA: Diagnosis not present

## 2019-10-06 LAB — CBC
HCT: 32.5 % — ABNORMAL LOW (ref 36.0–46.0)
Hemoglobin: 10.2 g/dL — ABNORMAL LOW (ref 12.0–15.0)
MCH: 30.4 pg (ref 26.0–34.0)
MCHC: 31.4 g/dL (ref 30.0–36.0)
MCV: 97 fL (ref 80.0–100.0)
Platelets: 111 10*3/uL — ABNORMAL LOW (ref 150–400)
RBC: 3.35 MIL/uL — ABNORMAL LOW (ref 3.87–5.11)
RDW: 20.8 % — ABNORMAL HIGH (ref 11.5–15.5)
WBC: 3 10*3/uL — ABNORMAL LOW (ref 4.0–10.5)
nRBC: 0 % (ref 0.0–0.2)

## 2019-10-06 LAB — HIV ANTIBODY (ROUTINE TESTING W REFLEX): HIV Screen 4th Generation wRfx: NONREACTIVE

## 2019-10-06 LAB — TYPE AND SCREEN
ABO/RH(D): O POS
Antibody Screen: NEGATIVE

## 2019-10-06 MED ORDER — FENTANYL CITRATE (PF) 2500 MCG/50ML IJ SOLN
INTRAMUSCULAR | Status: DC | PRN
Start: 2019-10-06 — End: 2019-10-07
  Administered 2019-10-06: 12 mL/h via EPIDURAL

## 2019-10-06 MED ORDER — COCONUT OIL OIL: 1.0000 "application " | TOPICAL_OIL | Status: DC | PRN

## 2019-10-06 MED ORDER — LACTATED RINGERS IV SOLN: INTRAVENOUS | Status: DC

## 2019-10-06 MED ORDER — FENTANYL-BUPIVACAINE-NACL 0.5-0.125-0.9 MG/250ML-% EP SOLN
12.0000 mL/h | EPIDURAL | Status: DC | PRN
  Filled 2019-10-06: qty 250

## 2019-10-06 MED ORDER — SENNOSIDES-DOCUSATE SODIUM 8.6-50 MG PO TABS
2.0000 | ORAL_TABLET | ORAL | Status: DC
  Administered 2019-10-06 – 2019-10-07 (×2): 2 via ORAL
  Filled 2019-10-06 (×2): qty 2

## 2019-10-06 MED ORDER — ONDANSETRON HCL 4 MG/2ML IJ SOLN: 4.0000 mg | Freq: Four times a day (QID) | INTRAMUSCULAR | Status: DC | PRN

## 2019-10-06 MED ORDER — EPHEDRINE 5 MG/ML INJ: 10.0000 mg | INTRAVENOUS | Status: DC | PRN

## 2019-10-06 MED ORDER — OXYCODONE-ACETAMINOPHEN 5-325 MG PO TABS: 1.0000 | ORAL_TABLET | ORAL | Status: DC | PRN

## 2019-10-06 MED ORDER — LACTATED RINGERS IV SOLN: 500.0000 mL | Freq: Once | INTRAVENOUS | Status: DC

## 2019-10-06 MED ORDER — PHENYLEPHRINE 40 MCG/ML (10ML) SYRINGE FOR IV PUSH (FOR BLOOD PRESSURE SUPPORT)
80.0000 ug | PREFILLED_SYRINGE | INTRAVENOUS | Status: DC | PRN
  Filled 2019-10-06: qty 10

## 2019-10-06 MED ORDER — DIPHENHYDRAMINE HCL 25 MG PO CAPS: 25.0000 mg | ORAL_CAPSULE | Freq: Four times a day (QID) | ORAL | Status: DC | PRN

## 2019-10-06 MED ORDER — ACETAMINOPHEN 325 MG PO TABS: 650.0000 mg | ORAL_TABLET | ORAL | Status: DC | PRN

## 2019-10-06 MED ORDER — DIPHENHYDRAMINE HCL 50 MG/ML IJ SOLN: 12.5000 mg | INTRAMUSCULAR | Status: DC | PRN

## 2019-10-06 MED ORDER — SODIUM CHLORIDE 0.9 % IV SOLN
5.0000 10*6.[IU] | Freq: Once | INTRAVENOUS | Status: AC
  Administered 2019-10-06: 5 10*6.[IU] via INTRAVENOUS
  Filled 2019-10-06: qty 5

## 2019-10-06 MED ORDER — HYDROXYZINE HCL 50 MG PO TABS: 50.0000 mg | ORAL_TABLET | Freq: Four times a day (QID) | ORAL | Status: DC | PRN

## 2019-10-06 MED ORDER — SOD CITRATE-CITRIC ACID 500-334 MG/5ML PO SOLN: 30.0000 mL | ORAL | Status: DC | PRN

## 2019-10-06 MED ORDER — ONDANSETRON HCL 4 MG/2ML IJ SOLN: 4.0000 mg | INTRAMUSCULAR | Status: DC | PRN

## 2019-10-06 MED ORDER — TERBUTALINE SULFATE 1 MG/ML IJ SOLN: 0.2500 mg | Freq: Once | INTRAMUSCULAR | Status: DC | PRN

## 2019-10-06 MED ORDER — OXYTOCIN BOLUS FROM INFUSION
333.0000 mL | Freq: Once | INTRAVENOUS | Status: AC
  Administered 2019-10-06: 333 mL via INTRAVENOUS

## 2019-10-06 MED ORDER — PRENATAL MULTIVITAMIN CH
1.0000 | ORAL_TABLET | Freq: Every day | ORAL | Status: DC
  Administered 2019-10-07 – 2019-10-08 (×2): 1 via ORAL
  Filled 2019-10-06 (×2): qty 1

## 2019-10-06 MED ORDER — IBUPROFEN 600 MG PO TABS
600.0000 mg | ORAL_TABLET | Freq: Four times a day (QID) | ORAL | Status: DC
  Administered 2019-10-06 – 2019-10-08 (×6): 600 mg via ORAL
  Filled 2019-10-06 (×7): qty 1

## 2019-10-06 MED ORDER — OXYCODONE HCL 5 MG PO TABS: 5.0000 mg | ORAL_TABLET | Freq: Once | ORAL | Status: DC | PRN

## 2019-10-06 MED ORDER — WITCH HAZEL-GLYCERIN EX PADS: 1.0000 "application " | MEDICATED_PAD | CUTANEOUS | Status: DC | PRN

## 2019-10-06 MED ORDER — BUTORPHANOL TARTRATE 1 MG/ML IJ SOLN
1.0000 mg | INTRAMUSCULAR | Status: DC | PRN
  Administered 2019-10-06: 1 mg via INTRAVENOUS
  Filled 2019-10-06: qty 1

## 2019-10-06 MED ORDER — OXYTOCIN-SODIUM CHLORIDE 30-0.9 UT/500ML-% IV SOLN
1.0000 m[IU]/min | INTRAVENOUS | Status: DC
  Administered 2019-10-06: 2 m[IU]/min via INTRAVENOUS
  Filled 2019-10-06: qty 500

## 2019-10-06 MED ORDER — ZOLPIDEM TARTRATE 5 MG PO TABS: 5.0000 mg | ORAL_TABLET | Freq: Every evening | ORAL | Status: DC | PRN

## 2019-10-06 MED ORDER — ONDANSETRON HCL 4 MG PO TABS: 4.0000 mg | ORAL_TABLET | ORAL | Status: DC | PRN

## 2019-10-06 MED ORDER — LIDOCAINE HCL (PF) 1 % IJ SOLN
INTRAMUSCULAR | Status: DC | PRN
  Administered 2019-10-06: 8 mL via EPIDURAL

## 2019-10-06 MED ORDER — PENICILLIN G POT IN DEXTROSE 60000 UNIT/ML IV SOLN
3.0000 10*6.[IU] | INTRAVENOUS | Status: DC
  Administered 2019-10-06: 3 10*6.[IU] via INTRAVENOUS
  Filled 2019-10-06: qty 50

## 2019-10-06 MED ORDER — OXYTOCIN-SODIUM CHLORIDE 30-0.9 UT/500ML-% IV SOLN: 2.5000 [IU]/h | INTRAVENOUS | Status: DC

## 2019-10-06 MED ORDER — LACTATED RINGERS IV SOLN: 500.0000 mL | INTRAVENOUS | Status: DC | PRN

## 2019-10-06 MED ORDER — FENTANYL-BUPIVACAINE-NACL 0.5-0.125-0.9 MG/250ML-% EP SOLN: 12.0000 mL/h | EPIDURAL | Status: DC | PRN

## 2019-10-06 MED ORDER — SIMETHICONE 80 MG PO CHEW: 80.0000 mg | CHEWABLE_TABLET | ORAL | Status: DC | PRN

## 2019-10-06 MED ORDER — TETANUS-DIPHTH-ACELL PERTUSSIS 5-2.5-18.5 LF-MCG/0.5 IM SUSP: 0.5000 mL | Freq: Once | INTRAMUSCULAR | Status: DC

## 2019-10-06 MED ORDER — DIBUCAINE (PERIANAL) 1 % EX OINT: 1.0000 "application " | TOPICAL_OINTMENT | CUTANEOUS | Status: DC | PRN

## 2019-10-06 MED ORDER — ACETAMINOPHEN 325 MG PO TABS
650.0000 mg | ORAL_TABLET | ORAL | Status: DC | PRN
  Administered 2019-10-07: 650 mg via ORAL
  Filled 2019-10-06: qty 2

## 2019-10-06 MED ORDER — BENZOCAINE-MENTHOL 20-0.5 % EX AERO: 1.0000 "application " | INHALATION_SPRAY | CUTANEOUS | Status: DC | PRN

## 2019-10-06 MED ORDER — OXYCODONE-ACETAMINOPHEN 5-325 MG PO TABS: 2.0000 | ORAL_TABLET | ORAL | Status: DC | PRN

## 2019-10-06 MED ORDER — PHENYLEPHRINE 40 MCG/ML (10ML) SYRINGE FOR IV PUSH (FOR BLOOD PRESSURE SUPPORT): 80.0000 ug | PREFILLED_SYRINGE | INTRAVENOUS | Status: DC | PRN

## 2019-10-06 MED ORDER — LIDOCAINE HCL (PF) 1 % IJ SOLN: 30.0000 mL | INTRAMUSCULAR | Status: DC | PRN

## 2019-10-06 NOTE — H&P (Signed)
OB History and Physical   Shelby Kerr is a 24 y.o. female [phone] presenting for elective induction of labor at [redacted]w[redacted]d.  Pregnancy course notable for GBS positive status, and history of HSV.  She reports no current or ongoing outbreaks and has been compliant with valtrex.   OB History    Gravida  4   Para  1   Term  1   Preterm      AB  2   Living  1     SAB  1   TAB  1   Ectopic      Multiple  0   Live Births  1          Past Medical History:  Diagnosis Date  . Medical history non-contributory    Past Surgical History:  Procedure Laterality Date  . NO PAST SURGERIES     Family History: family history includes Hypertension in her mother. Social History:  reports that she has never smoked. She has never used smokeless tobacco. She reports that she does not drink alcohol and does not use drugs.     Maternal Diabetes: No Genetic Screening: Normal Maternal Ultrasounds/Referrals: Normal Fetal Ultrasounds or other Referrals:  None Maternal Substance Abuse:  No Significant Maternal Medications:  None Significant Maternal Lab Results:  Group B Strep positive Other Comments:  None  Review of Systems History   Blood pressure 105/70, pulse (!) 101, temperature 98.1 F (36.7 C), temperature source Oral, resp. rate 18, height 5\' 3"  (1.6 m), weight 71.4 kg, unknown if currently breastfeeding. Exam Physical Exam  Prenatal labs: ABO, Rh: O/Positive/-- (03/02 0000) Antibody: Negative (03/02 0000) Rubella: Immune (03/02 0000) RPR: Nonreactive (03/02 0000)  HBsAg: Negative (03/02 0000)  HIV: Non-reactive (03/02 0000)  GBS: Positive/-- (09/02 0000)   Assessment/Plan: . Admit to Labor and Delivery . Cervix: . PCN for GBS positive . Epidural when desired . 4/50/-3 on admission.  Will begin pit, titrate 2x2, and AROM when head better applied and after PCN coverage.  01-07-1981 10/06/2019, 11:30 AM

## 2019-10-06 NOTE — Anesthesia Procedure Notes (Signed)
Epidural Patient location during procedure: OB Start time: 10/06/2019 3:38 PM End time: 10/06/2019 3:42 PM  Staffing Anesthesiologist: Bethena Midget, MD  Preanesthetic Checklist Completed: patient identified, IV checked, site marked, risks and benefits discussed, surgical consent, monitors and equipment checked, pre-op evaluation and timeout performed  Epidural Patient position: sitting Prep: DuraPrep and site prepped and draped Patient monitoring: continuous pulse ox and blood pressure Approach: midline Location: L3-L4 Injection technique: LOR air  Needle:  Needle type: Tuohy  Needle gauge: 17 G Needle length: 9 cm and 9 Needle insertion depth: 6 cm Catheter type: closed end flexible Catheter size: 19 Gauge Catheter at skin depth: 11 cm Test dose: negative  Assessment Events: blood not aspirated, injection not painful, no injection resistance, no paresthesia and negative IV test

## 2019-10-06 NOTE — Progress Notes (Signed)
At bedside for AROM.  Cervix 4/70/-2, head well applied. AROM performed in usual fashion, clear fluid noted.  Patient tolerated.  FHT: 125/moderate/pos accels/no decels.  Nilda Simmer MD

## 2019-10-06 NOTE — Progress Notes (Signed)
Visual GU exam reveals no lesions, no apparent HSV outbreak  Nilda Simmer MD

## 2019-10-06 NOTE — Anesthesia Preprocedure Evaluation (Signed)
Anesthesia Evaluation  Patient identified by MRN, date of birth, ID band Patient awake    Reviewed: Allergy & Precautions, H&P , NPO status , Patient's Chart, lab work & pertinent test results, reviewed documented beta blocker date and time   Airway Mallampati: I  TM Distance: >3 FB Neck ROM: full    Dental no notable dental hx. (+) Teeth Intact, Dental Advisory Given   Pulmonary neg pulmonary ROS,    Pulmonary exam normal breath sounds clear to auscultation       Cardiovascular negative cardio ROS Normal cardiovascular exam Rhythm:regular Rate:Normal     Neuro/Psych negative neurological ROS  negative psych ROS   GI/Hepatic negative GI ROS, Neg liver ROS,   Endo/Other  negative endocrine ROS  Renal/GU negative Renal ROS  negative genitourinary   Musculoskeletal   Abdominal   Peds  Hematology negative hematology ROS (+)   Anesthesia Other Findings   Reproductive/Obstetrics (+) Pregnancy                             Anesthesia Physical Anesthesia Plan  ASA: II  Anesthesia Plan: Epidural   Post-op Pain Management:    Induction:   PONV Risk Score and Plan:   Airway Management Planned:   Additional Equipment: None  Intra-op Plan:   Post-operative Plan:   Informed Consent: I have reviewed the patients History and Physical, chart, labs and discussed the procedure including the risks, benefits and alternatives for the proposed anesthesia with the patient or authorized representative who has indicated his/her understanding and acceptance.     Dental Advisory Given  Plan Discussed with: Anesthesiologist  Anesthesia Plan Comments: (Labs checked- platelets confirmed with RN in room. Fetal heart tracing, per RN, reported to be stable enough for sitting procedure. Discussed epidural, and patient consents to the procedure:  included risk of possible headache,backache, failed block,  allergic reaction, and nerve injury. This patient was asked if she had any questions or concerns before the procedure started.)        Anesthesia Quick Evaluation  

## 2019-10-06 NOTE — Lactation Note (Signed)
This note was copied from a baby's chart. Lactation Consultation Note Baby 3 hrs old. Mom stated baby has BF well.  Mom holding baby STS. Baby sleeping. Mom stated baby BF in football position. Mom BF her 24 yr old daughter for 1 month and had to stop d/t allergies.  Mom has short shaft nipples that evert well w/stimulation. Encouraged to use finger stimulation before latching to evert nipple more. Shells given to wear in am. Hand expression w/colostrum demonstrated.  Newborn behavior, feeding habits, STS, I&O, breast massage, supply and demand. Mom encouraged to feed baby 8-12 times/24 hours and with feeding cues. Mom encouraged to waken baby for feeds if hasn't cued to feed in 3 hrs.  Baby is + DAT. Encouraged mom to hand express and give baby back EBM. Asked mom if she would like to use DEBP while she is here, mom stated yes. Mom stated she had ordered a pump and should arrive 3 days. Mom shown how to use DEBP & how to disassemble, clean, & reassemble parts. Mom knows to pump q3h for 15-20 min.  Encouraged mom to call for assistance or questions. Lactation brochure given. LC OP services mentioned.    Patient Name: Shelby Kerr [id number] Date: 10/06/2019 Reason for consult: Initial assessment;Term   Maternal Data Has patient been taught Hand Expression?: Yes Does the patient have breastfeeding experience prior to this delivery?: Yes  Feeding Feeding Type: Breast Fed  LATCH Score Latch: Grasps breast easily, tongue down, lips flanged, rhythmical sucking.  Audible Swallowing: A few with stimulation  Type of Nipple: Everted at rest and after stimulation  Comfort (Breast/Nipple): Soft / non-tender  Hold (Positioning): Assistance needed to correctly position infant at breast and maintain latch.  LATCH Score: 8  Interventions Interventions: Breast feeding basics reviewed;Breast compression;Skin to skin;Breast massage;Hand express  Lactation Tools  Discussed/Used WIC Program: Yes   Consult Status Consult Status: Follow-up Date: 10/07/19 Follow-up type: In-patient    Shelby Kerr 10/06/2019, 10:01 PM

## 2019-10-06 NOTE — Progress Notes (Signed)
At bedside shortly after precipitous delivery with prolonged deceleration, please see Dr. Orville Govern documentation.  Appreciate in-house faculty.  Nilda Simmer MD

## 2019-10-07 LAB — CBC
HCT: 29.2 % — ABNORMAL LOW (ref 36.0–46.0)
Hemoglobin: 9.7 g/dL — ABNORMAL LOW (ref 12.0–15.0)
MCH: 31.6 pg (ref 26.0–34.0)
MCHC: 33.2 g/dL (ref 30.0–36.0)
MCV: 95.1 fL (ref 80.0–100.0)
Platelets: 95 10*3/uL — ABNORMAL LOW (ref 150–400)
RBC: 3.07 MIL/uL — ABNORMAL LOW (ref 3.87–5.11)
RDW: 20.7 % — ABNORMAL HIGH (ref 11.5–15.5)
WBC: 3.7 10*3/uL — ABNORMAL LOW (ref 4.0–10.5)
nRBC: 0 % (ref 0.0–0.2)

## 2019-10-07 LAB — RPR: RPR Ser Ql: NONREACTIVE

## 2019-10-07 NOTE — Progress Notes (Signed)
Post Partum Day 1 Subjective: no complaints, up ad lib, voiding and tolerating PO  Objective: Blood pressure (!) 90/57, pulse (!) 58, temperature 98 F (36.7 C), temperature source Oral, resp. rate 18, height 5\' 3"  (1.6 m), weight 71.4 kg, SpO2 100 %, unknown if currently breastfeeding.  Physical Exam:  General: alert, cooperative, appears stated age and no distress Lochia: appropriate Uterine Fundus: firm Incision: healing well DVT Evaluation: No evidence of DVT seen on physical exam.  Recent Labs    10/06/19 1144 10/07/19 0502  HGB 10.2* 9.7*  HCT 32.5* 29.2*    Assessment/Plan: Plan for discharge tomorrow  Platelets slight drop.  Will recheck in am.  Bleeding is stable   LOS: 1 day   12/07/19 10/07/2019, 10:06 AM

## 2019-10-07 NOTE — Lactation Note (Signed)
This note was copied from a baby's chart. Lactation Consultation Note  Patient Name: Shelby Kerr [id number] Date: 10/07/2019 Reason for consult: Follow-up assessment;Term;Infant weight loss  Baby is 17 hours old and as LC entered the room, baby latched on the left breast with depth, swallows noted. And per mom comfortable.  LC discussed intermittent breast compressions during a latch to help the baby stay in a consistent swallowing pattern so she doesn't get disorganized and release.  When mom did the compressions baby increased her sucking and swallows.  Noted on the opposite breast the areola had some edema and she had shells sitting at her beside. LC stressed the importance of wearing the shells between feedings to enhance the compressibility of the areolas for a deeper latch.  LC discussed nutritive vs non - nutritive feeding patterns and the importance of watching the baby for hanging out latched.  Mom denies sore nipples.  DEBP was set up at the bedside. LC recommended since the baby is breast feeding well to work on latching , and showed her the hand pump and she can pre-pump to prime the milk ducts to enhance the volume of milk the baby is receiving.     Maternal Data Has patient been taught Hand Expression?: Yes  Feeding Feeding Type:  (baby latched with depth / swallows)  LATCH Score Latch:  (latched with depth)  Audible Swallowing:  (swallows)  Type of Nipple: Everted at rest and after stimulation  Comfort (Breast/Nipple):  (per mom comfortable)  Hold (Positioning): Assistance needed to correctly position infant at breast and maintain latch.  LATCH Score: 8  Interventions Interventions: Breast feeding basics reviewed;Breast massage;Breast compression;Adjust position;Support pillows;Shells;Hand pump  Lactation Tools Discussed/Used Pump Review: Setup, frequency, and cleaning;Milk Storage Initiated by:: MAI Date initiated:: 10/07/19   Consult  Status Consult Status: Follow-up Date: 10/08/19 Follow-up type: In-patient    [name] [name] 10/07/2019, 12:47 PM

## 2019-10-07 NOTE — Progress Notes (Signed)
This RN called Dr. Rana Snare to ask if pt could continue to receive her scheduled Ibuprofen due to drop in platelets.  Dr. Rana Snare stated pt was okay to receive scheduled Ibuprofen.

## 2019-10-07 NOTE — Lactation Note (Signed)
This note was copied from a baby's chart. Lactation Consultation Note  Patient Name: Girl Emilyrose Darrah [id number]'Y Date: 10/07/2019 Reason for consult: Follow-up assessment (mom and baby asleep - will F/U )   Maternal Data    Feeding    LATCH Score                   Interventions    Lactation Tools Discussed/Used     Consult Status Consult Status: Follow-up Date: 10/07/19 Follow-up type: In-patient    [name] Knut Rondinelli 10/07/2019, 9:59 AM

## 2019-10-07 NOTE — Anesthesia Postprocedure Evaluation (Signed)
Anesthesia Post Note  Patient: Natallie Larock  Procedure(s) Performed: AN AD HOC LABOR EPIDURAL     Patient location during evaluation: Mother Baby Anesthesia Type: Epidural Level of consciousness: awake and alert and oriented Pain management: satisfactory to patient Vital Signs Assessment: post-procedure vital signs reviewed and stable Respiratory status: spontaneous breathing and nonlabored ventilation Cardiovascular status: stable Postop Assessment: no headache, no backache, no signs of nausea or vomiting, adequate PO intake, patient able to bend at knees and able to ambulate (patient up walking) Anesthetic complications: no   No complications documented.  Last Vitals:  Vitals:   10/07/19 0200 10/07/19 0555  BP: 110/81 (!) 90/57  Pulse: 81 (!) 58  Resp: 18 18  Temp: 36.7 C 36.7 C  SpO2: 100% 100%    Last Pain:  Vitals:   10/07/19 0555  TempSrc: Oral  PainSc: 2    Pain Goal:                   Madison Hickman

## 2019-10-08 LAB — CBC
HCT: 30.1 % — ABNORMAL LOW (ref 36.0–46.0)
Hemoglobin: 9.7 g/dL — ABNORMAL LOW (ref 12.0–15.0)
MCH: 31.1 pg (ref 26.0–34.0)
MCHC: 32.2 g/dL (ref 30.0–36.0)
MCV: 96.5 fL (ref 80.0–100.0)
Platelets: 106 10*3/uL — ABNORMAL LOW (ref 150–400)
RBC: 3.12 MIL/uL — ABNORMAL LOW (ref 3.87–5.11)
RDW: 21.2 % — ABNORMAL HIGH (ref 11.5–15.5)
WBC: 4.3 10*3/uL (ref 4.0–10.5)
nRBC: 0 % (ref 0.0–0.2)

## 2019-10-08 NOTE — Discharge Summary (Signed)
   Postpartum Discharge Summary  Date of Service October 07/2019     Patient Name: Shelby Kerr DOB: 01/26/1995 MRN: [id number]  Date of admission: 10/06/2019 Delivery date:10/06/2019  Delivering provider: SHIVAJI, SHANTI M  Date of discharge: 10/08/2019  Admitting diagnosis: Pregnancy [Z34.90] Intrauterine pregnancy: [redacted]w[redacted]d     Secondary diagnosis:  Active Problems:   Pregnancy  Additional problems: none    Discharge diagnosis: Term Pregnancy Delivered                                              Post partum procedures:none Augmentation: AROM and Pitocin Complications: None  Hospital course: Induction of Labor With Vaginal Delivery   24 y.o. yo G4P2022 at [redacted]w[redacted]d was admitted to the hospital 10/06/2019 for induction of labor.  Indication for induction: Postdates.  Patient had an uncomplicated labor course as follows: Membrane Rupture Time/Date: 3:11 PM ,10/06/2019   Delivery Method:Vaginal, Spontaneous  Episiotomy: None  Lacerations:  None  Details of delivery can be found in separate delivery note.  Patient had a routine postpartum course. Patient is discharged home 10/08/19.  Newborn Data: Birth date:10/06/2019  Birth time:6:13 PM  Gender:Female  Living status:Living  Apgars:9 ,9  Weight:3145 g   Magnesium Sulfate received: No BMZ received: No Rhophylac:No MMR:Yes T-DaP:Given prenatally Flu: No Transfusion:No  Physical exam  Vitals:   10/07/19 1129 10/07/19 1348 10/07/19 2020 10/08/19 0600  BP: 95/61 101/68 101/64 108/67  Pulse: 72 66 69 67  Resp: 18 18 18 18  Temp: 98.7 F (37.1 C) 98.6 F (37 C) 98.2 F (36.8 C) 98 F (36.7 C)  TempSrc: Oral Oral Oral Oral  SpO2:   100% 100%  Weight:      Height:       General: alert, cooperative and no distress Lochia: appropriate Uterine Fundus: firm Incision: Healing well with no significant drainage DVT Evaluation: No evidence of DVT seen on physical exam. Labs: Lab Results  Component Value Date   WBC 4.3  10/08/2019   HGB 9.7 (L) 10/08/2019   HCT 30.1 (L) 10/08/2019   MCV 96.5 10/08/2019   PLT 106 (L) 10/08/2019   CMP Latest Ref Rng & Units 01/21/2017  Glucose 65 - 99 mg/dL 83  BUN 6 - 20 mg/dL 11  Creatinine 0.44 - 1.00 mg/dL 0.44  Sodium 135 - 145 mmol/L 135  Potassium 3.5 - 5.1 mmol/L 4.7  Chloride 101 - 111 mmol/L 104  CO2 22 - 32 mmol/L 23  Calcium 8.9 - 10.3 mg/dL 9.9  Total Protein 6.5 - 8.1 g/dL 7.7  Total Bilirubin 0.3 - 1.2 mg/dL 0.5  Alkaline Phos 38 - 126 U/L 56  AST 15 - 41 U/L 21  ALT 14 - 54 U/L 19   Edinburgh Score: Edinburgh Postnatal Depression Scale Screening Tool 10/07/2019  I have been able to laugh and see the funny side of things. 0  I have looked forward with enjoyment to things. 0  I have blamed myself unnecessarily when things went wrong. 2  I have been anxious or worried for no good reason. 1  I have felt scared or panicky for no good reason. 1  Things have been getting on top of me. 1  I have been so unhappy that I have had difficulty sleeping. 0  I have felt sad or miserable. 1  I have been so unhappy that I   have been crying. 1  The thought of harming myself has occurred to me. 0  Edinburgh Postnatal Depression Scale Total 7      After visit meds:  Allergies as of 10/08/2019   No Known Allergies     Medication List    TAKE these medications   prenatal multivitamin Tabs tablet Take 1 tablet by mouth daily at 12 noon.        Discharge home in stable condition Infant Feeding: Breast Infant Disposition:home with mother Discharge instruction: per After Visit Summary and Postpartum booklet. Activity: Advance as tolerated. Pelvic rest for 6 weeks.  Diet: routine diet Anticipated Birth Control: Unsure Postpartum Appointment:6 weeks Additional Postpartum F/U: not applicable Future Appointments:No future appointments. Follow up Visit:      10/08/2019 Michelle L Grewal, MD  

## 2019-10-08 NOTE — Lactation Note (Signed)
This note was copied from a baby's chart. Lactation Consultation Note  Patient Name: Shelby Kerr [id number] Date: 10/08/2019 Reason for consult: Follow-up assessment;Term;Infant weight loss;Other (Comment);Nipple pain/trauma (6 % weight loss)  Baby is 53 hours old  Baby awake and rooting, / mom changed a large stool diaper. Mom latched the baby in the football / left breast with depth and increased swallows. Breast are filling and mom mentioned nipples are sensitive.  LC provided the comfort gels x 6 days after feedings, alternating with breast shells while awake with coconut oil.  LC also recommended prior to latching - breast massage, hand express, pre-pump with hand pump if needed and latch with firm support. Sore nipple and engorgement prevention and tx reviewed.  Per mom has  DEBP - Spectra 2.  Mom also has the hand pump , DEBP kit, coconut oil and breast shells.  LC reminded her she call with questions for breastfeeding and has the pamphlet with phone numbers.  [place] praised mom for her efforts breast feeding.    Maternal Data    Feeding Feeding Type: Breast Fed  LATCH Score Latch: Grasps breast easily, tongue down, lips flanged, rhythmical sucking.  Audible Swallowing: Spontaneous and intermittent  Type of Nipple: Everted at rest and after stimulation  Comfort (Breast/Nipple): Filling, red/small blisters or bruises, mild/mod discomfort  Hold (Positioning): Assistance needed to correctly position infant at breast and maintain latch.  LATCH Score: 8  Interventions Interventions: Breast feeding basics reviewed;Assisted with latch;Breast massage;Skin to skin;Hand express;Reverse pressure;Breast compression;Adjust position;Support pillows;Position options;Shells;Comfort gels;Hand pump;DEBP  Lactation Tools Discussed/Used Tools: Shells;Pump;Comfort gels;Flanges Flange Size: 24;27 Shell Type: Inverted Breast pump type: Manual;Double-Electric Breast Pump   Consult  Status Consult Status: Complete Date: 10/08/19    [name] 10/08/2019, 10:26 AM

## 2020-03-17 ENCOUNTER — Observation Stay (HOSPITAL_COMMUNITY)
Admission: EM | Admit: 2020-03-17 | Discharge: 2020-03-18 | Disposition: A | Discharge status: Home / Self Care | Payer: Managed Care, Other (non HMO) | Attending: Internal Medicine | Admitting: Internal Medicine

## 2020-03-17 ENCOUNTER — Emergency Department (HOSPITAL_COMMUNITY): Payer: Managed Care, Other (non HMO)

## 2020-03-17 ENCOUNTER — Encounter (HOSPITAL_COMMUNITY): Payer: Self-pay

## 2020-03-17 ENCOUNTER — Other Ambulatory Visit: Payer: Self-pay

## 2020-03-17 DIAGNOSIS — D649 Anemia, unspecified: Principal | ICD-10-CM | POA: Insufficient documentation

## 2020-03-17 DIAGNOSIS — E538 Deficiency of other specified B group vitamins: Secondary | ICD-10-CM | POA: Diagnosis not present

## 2020-03-17 DIAGNOSIS — D61818 Other pancytopenia: Secondary | ICD-10-CM | POA: Diagnosis not present

## 2020-03-17 DIAGNOSIS — R0602 Shortness of breath: Secondary | ICD-10-CM | POA: Diagnosis present

## 2020-03-17 DIAGNOSIS — Z20822 Contact with and (suspected) exposure to covid-19: Secondary | ICD-10-CM | POA: Diagnosis not present

## 2020-03-17 LAB — PREPARE RBC (CROSSMATCH)

## 2020-03-17 LAB — CBC WITH DIFFERENTIAL/PLATELET
Abs Immature Granulocytes: 0 10*3/uL (ref 0.00–0.07)
Basophils Absolute: 0 10*3/uL (ref 0.0–0.1)
Basophils Relative: 0 %
Eosinophils Absolute: 0 10*3/uL (ref 0.0–0.5)
Eosinophils Relative: 0 %
HCT: 22.5 % — ABNORMAL LOW (ref 36.0–46.0)
Hemoglobin: 7.4 g/dL — ABNORMAL LOW (ref 12.0–15.0)
Immature Granulocytes: 0 %
Lymphocytes Relative: 71 %
Lymphs Abs: 1.6 10*3/uL (ref 0.7–4.0)
MCH: 35.4 pg — ABNORMAL HIGH (ref 26.0–34.0)
MCHC: 32.9 g/dL (ref 30.0–36.0)
MCV: 107.7 fL — ABNORMAL HIGH (ref 80.0–100.0)
Monocytes Absolute: 0.6 10*3/uL (ref 0.1–1.0)
Monocytes Relative: 27 %
Neutro Abs: 0 10*3/uL — CL (ref 1.7–7.7)
Neutrophils Relative %: 2 %
Platelets: 49 10*3/uL — ABNORMAL LOW (ref 150–400)
RBC: 2.09 MIL/uL — ABNORMAL LOW (ref 3.87–5.11)
RDW: 17.2 % — ABNORMAL HIGH (ref 11.5–15.5)
WBC: 2.2 10*3/uL — ABNORMAL LOW (ref 4.0–10.5)
nRBC: 0 % (ref 0.0–0.2)

## 2020-03-17 LAB — COMPREHENSIVE METABOLIC PANEL
ALT: 11 U/L (ref 0–44)
AST: 12 U/L — ABNORMAL LOW (ref 15–41)
Albumin: 3.5 g/dL (ref 3.5–5.0)
Alkaline Phosphatase: 80 U/L (ref 38–126)
Anion gap: 6 (ref 5–15)
BUN: 8 mg/dL (ref 6–20)
CO2: 27 mmol/L (ref 22–32)
Calcium: 9 mg/dL (ref 8.9–10.3)
Chloride: 103 mmol/L (ref 98–111)
Creatinine, Ser: 0.49 mg/dL (ref 0.44–1.00)
GFR, Estimated: >60 mL/min (ref >60)
Glucose, Bld: 92 mg/dL (ref 70–99)
Potassium: 4.1 mmol/L (ref 3.5–5.1)
Sodium: 136 mmol/L (ref 135–145)
Total Bilirubin: 0.5 mg/dL (ref 0.3–1.2)
Total Protein: 7.2 g/dL (ref 6.5–8.1)

## 2020-03-17 LAB — CBC
HCT: 20.2 % — ABNORMAL LOW (ref 36.0–46.0)
Hemoglobin: 6.7 g/dL — CL (ref 12.0–15.0)
MCH: 37.2 pg — ABNORMAL HIGH (ref 26.0–34.0)
MCHC: 33.2 g/dL (ref 30.0–36.0)
MCV: 112.2 fL — ABNORMAL HIGH (ref 80.0–100.0)
Platelets: 51 10*3/uL — ABNORMAL LOW (ref 150–400)
RBC: 1.8 MIL/uL — ABNORMAL LOW (ref 3.87–5.11)
RDW: 14.5 % (ref 11.5–15.5)
WBC: 1.7 10*3/uL — ABNORMAL LOW (ref 4.0–10.5)
nRBC: 0 % (ref 0.0–0.2)

## 2020-03-17 LAB — RETICULOCYTES
Immature Retic Fract: 29.2 % — ABNORMAL HIGH (ref 2.3–15.9)
RBC.: 2.06 MIL/uL — ABNORMAL LOW (ref 3.87–5.11)
Retic Count, Absolute: 34 10*3/uL (ref 19.0–186.0)
Retic Ct Pct: 1.7 % (ref 0.4–3.1)

## 2020-03-17 LAB — LACTATE DEHYDROGENASE: LDH: 178 U/L (ref 98–192)

## 2020-03-17 LAB — VITAMIN B12: Vitamin B-12: 150 pg/mL — ABNORMAL LOW (ref 180–914)

## 2020-03-17 LAB — TROPONIN I (HIGH SENSITIVITY)
Troponin I (High Sensitivity): 2 ng/L (ref <18)
Troponin I (High Sensitivity): 3 ng/L (ref <18)

## 2020-03-17 MED ORDER — ALLOPURINOL 300 MG PO TABS
300.0000 mg | ORAL_TABLET | Freq: Every day | ORAL | Status: DC
  Administered 2020-03-17: 300 mg via ORAL
  Filled 2020-03-17: qty 1

## 2020-03-17 MED ORDER — ONDANSETRON HCL 4 MG PO TABS: 4.0000 mg | ORAL_TABLET | Freq: Four times a day (QID) | ORAL | Status: DC | PRN

## 2020-03-17 MED ORDER — ACETAMINOPHEN 650 MG RE SUPP: 650.0000 mg | Freq: Four times a day (QID) | RECTAL | Status: DC | PRN

## 2020-03-17 MED ORDER — ONDANSETRON HCL 4 MG/2ML IJ SOLN: 4.0000 mg | Freq: Four times a day (QID) | INTRAMUSCULAR | Status: DC | PRN

## 2020-03-17 MED ORDER — SODIUM CHLORIDE 0.9% IV SOLUTION: Freq: Once | INTRAVENOUS | Status: AC

## 2020-03-17 MED ORDER — CYANOCOBALAMIN 1000 MCG/ML IJ SOLN
1000.0000 ug | Freq: Once | INTRAMUSCULAR | Status: AC
  Administered 2020-03-17: 1000 ug via SUBCUTANEOUS
  Filled 2020-03-17: qty 1

## 2020-03-17 MED ORDER — LEVOFLOXACIN 500 MG PO TABS: 500.0000 mg | ORAL_TABLET | Freq: Every day | ORAL | Status: DC

## 2020-03-17 MED ORDER — SODIUM CHLORIDE 0.9 % IV SOLN: INTRAVENOUS | Status: DC

## 2020-03-17 MED ORDER — ACETAMINOPHEN 325 MG PO TABS: 650.0000 mg | ORAL_TABLET | Freq: Four times a day (QID) | ORAL | Status: DC | PRN

## 2020-03-17 NOTE — Consult Note (Signed)
New Hematology/Oncology Consult   Requesting ZO:XWRUEAVWUJW Tegeler        Reason for Consult: Pancytopenia  HPI: Ms. Lynn delivered a baby after an induced labor in October 2021.  She reports not feeling well beginning in late November and December 2021.  She reports generalized joint pain, fatigue, depression symptoms, and she had a sore throat/neck lymphadenopathy in January 2022.  She reports being treated with antibiotics for the sore throat while in [place].  For the past 4 days she has developed exertional dyspnea.  She has "dizziness "when standing ".  She had a negative COVID-19 test earlier this week.  She was seen by her primary provider yesterday she was called today and told to report to the emergency room due to abnormal CBC.  She noted small ecchymoses on the arms recently.  She has vaginal spotting with IUD in place.  No other bleeding.  No fever.    Past Medical History:  Diagnosis Date  .  G4 P2, 1 miscarriage, 1 abortion   :  Past Surgical History:  Procedure Laterality Date  . NO PAST SURGERIES    :  No current facility-administered medications for this encounter.  Current Outpatient Medications:  .  Prenatal Vit-Fe Fumarate-FA (PRENATAL MULTIVITAMIN) TABS tablet, Take 1 tablet by mouth daily at 12 noon., Disp: , Rfl: :  :  No Known Allergies:  FH: No family history of hematologic disease or malignancy  SOCIAL HISTORY: She lives with her fianc and children in [place].  She does not smoke cigarettes or use alcohol.  No transfusion history.  She is a [employer] trader.  No risk factor for HIV or hepatitis.  She has not received a COVID-19 vaccine.  Review of Systems:  Positives include: Bleeding with brushing her teeth, exertional dyspnea, "dizzy "when standing, bruising over the arms, difficulty with memory recall, joint pain, "bumps "in the axillae and thighs after delivery of her child  A complete ROS was otherwise negative.   Physical  Exam:  Blood pressure 112/68, pulse 91, temperature 98.1 F (36.7 C), temperature source Oral, resp. rate 18, last menstrual period 03/09/2020, SpO2 100 %, unknown if currently breastfeeding.  HEENT: No thrush, ulcers, or bleeding, neck without mass Lungs: Clear bilaterally Cardiac: Regular rate and rhythm Abdomen: Nontender, no hepatosplenomegaly Vascular: No leg edema Lymph nodes: No cervical, supraclavicular, axillary, or inguinal nodes Neurologic: Alert and oriented, the motor exam appears intact in the upper and lower extremities bilaterally, finger-nose testing is normal Skin: No ecchymoses, petechiae, or rash Musculoskeletal: No spine tenderness  LABS:  Recent Labs    03/17/20 1419  WBC 1.7*  HGB 6.7*  HCT 20.2*  PLT 51*   Reticulocyte count 34 (1.7%)  Blood smear: The platelets are decreased in number.  The majority the platelets are small.  No platelet clumps.  There are macrocytes (some polychromatophils), teardrops, and ovalocytes.  No nucleated red cells.  The white blood cells are decreased in number.  The majority of the white cells are mature appearing lymphocytes.  I saw no neutrophils upon review of 2 blood smears.  There are a few large mononuclear cells with nucleoli, dark blue cytoplasm, and cytoplasmic vacuoles.  I saw 1 binucleate form.  No Auer rods or granules.  Recent Labs    03/17/20 1419  NA 136  K 4.1  CL 103  CO2 27  GLUCOSE 92  BUN 8  CREATININE 0.49  CALCIUM 9.0   LDH 178, B12 150 (180-914)  Alkaline phosphatase 80,  albumin 3.5, AST 12, ALT 11, total protein 7.2, bilirubin 0.5   RADIOLOGY:  DG Chest 2 View  Result Date: 03/17/2020 CLINICAL DATA:  Shortness of breath. EXAM: CHEST - 2 VIEW COMPARISON:  None. FINDINGS: The heart size and mediastinal contours are within normal limits. Both lungs are clear. The visualized skeletal structures are unremarkable. IMPRESSION: No active cardiopulmonary disease. Electronically Signed   By: Aram Candela M.D.   On: 03/17/2020 19:27    Assessment and Plan:   1.  Pancytopenia  Red cell macrocytosis  Severe neutropenia  2.  Vitamin B12 deficiency 3.  G4 P2, 1 abortion, 1 miscarriage 4.  Anemia and mild thrombocytopenia at childbirth October 2021 5.  Upper respiratory infection January 2022   Ms. Olguin was referred to the emergency room by her primary provider after she was noted to have pancytopenia on a CBC earlier this week.  (I do not have the outside CBC available tonight)  She has vitamin B12 deficiency, but I do not think vitamin B12 deficiency is likely to explain the severe pancytopenia in her case.  She has clinical history concerning for a hematopoietic malignancy with a recent pharyngitis gum bleeding.  She had a normal MCV at the time of childbirth in October 2021.  There are blast forms on the peripheral blood smear.  I am concerned she may have acute leukemia.  I discussed the case with Dr. Renato Gails at [facility] and he is in agreement.  He will accept Ms. Mokry and transfer to the leukemia service at [facility].  She will begin intravenous hydration, allopurinol, and Levaquin prophylaxis.  She will receive a dose of vitamin B12.  We will obtain a DIC panel and uric acid level.  The plan is to transfer Ms. Noller to [facility] when a bed becomes available.  I will be available to see her in [place] as needed.    Thornton Papas, MD 03/17/2020, 9:17 PM

## 2020-03-17 NOTE — H&P (Signed)
History and Physical    Shelby Kerr JHE:[id number] DOB: 1995-09-15 DOA: 03/17/2020  PCP: Patient, No Pcp Per  Patient coming from: Home  I have personally briefly reviewed patient's old medical records in [facility] Link  Chief Complaint: Fatigue  HPI: Shelby Kerr is a 25 y.o. female with medical history significant of previously healthy.  Pt had successful pregnancy with delivery in Oct.  Since that time she has had progressive generalized weakness, fatigue, malaise.  Symptoms insidious in onset, progressively worsening, now severe.  Not on any meds at home, though they were going to start her on lexapro for possible PPD.  Labs earlier this week at PCPs office showed pancytopenia, prompting them to send her to ED for further evaluation.  No fever, headache, no vomiting, no rash.  Has had gum bleeding with tooth brushing.   ED Course: Pt with pancytopenia: WBC 1.7k, HGB 6.7, platelets 51.  This down from Oct: 4.3, 9.7, and 106.   Review of Systems: As per HPI, otherwise all review of systems negative.  Past Medical History:  Diagnosis Date  . Medical history non-contributory     Past Surgical History:  Procedure Laterality Date  . NO PAST SURGERIES       reports that she has never smoked. She has never used smokeless tobacco. She reports that she does not drink alcohol and does not use drugs.  No Known Allergies  Family History  Problem Relation Age of Onset  . Hypertension Mother      Prior to Admission medications   Medication Sig Start Date End Date Taking? Authorizing Provider  escitalopram (LEXAPRO) 10 MG tablet Take 10 mg by mouth at bedtime. 03/15/20  Yes [provider]  ibuprofen (ADVIL) 200 MG tablet Take 800 mg by mouth every 6 (six) hours as needed for headache or moderate pain.   Yes [provider]  levonorgestrel (MIRENA, 52 MG,) 20 MCG/24HR IUD 1 Intra Uterine Device by Intrauterine route.   Yes [provider]  valACYclovir (VALTREX) 500 MG tablet Take 500 mg by mouth in the morning and at bedtime.   Yes [provider]    Physical Exam: Vitals:   03/17/20 1949 03/17/20 2010 03/17/20 2022 03/17/20 2100  BP: 109/74 109/71 112/68 105/73  Pulse: 86 96 91 83  Resp: 16 19 18 19   Temp: 98.4 F (36.9 C) 98.4 F (36.9 C) 98.1 F (36.7 C) 98.4 F (36.9 C)  TempSrc: Oral Oral Oral Oral  SpO2: 100%  100% 100%    Constitutional: NAD, calm, comfortable Eyes: PERRL, lids and conjunctivae normal ENMT: Mucous membranes are moist. Posterior pharynx clear of any exudate or lesions.Normal dentition.  Neck: normal, supple, no masses, no thyromegaly Respiratory: clear to auscultation bilaterally, no wheezing, no crackles. Normal respiratory effort. No accessory muscle use.  Cardiovascular: Regular rate and rhythm, no murmurs / rubs / gallops. No extremity edema. 2+ pedal pulses. No carotid bruits.  Abdomen: no tenderness, no masses palpated. No hepatosplenomegaly. Bowel sounds positive.  Musculoskeletal: no clubbing / cyanosis. No joint deformity upper and lower extremities. Good ROM, no contractures. Normal muscle tone.  Skin: no rashes, lesions, ulcers. No induration Neurologic: CN 2-12 grossly intact. Sensation intact, DTR normal. Strength 5/5 in all 4.  Psychiatric: Normal judgment and insight. Alert and oriented x 3. Normal mood.    Labs on Admission: I have personally reviewed following labs and imaging studies  CBC: Recent Labs  Lab 03/17/20 1419  WBC 1.7*  HGB 6.7*  HCT 20.2*  MCV 112.2*  PLT 51*   Basic Metabolic Panel: Recent Labs  Lab 03/17/20 1419  NA 136  K 4.1  CL 103  CO2 27  GLUCOSE 92  BUN 8  CREATININE 0.49  CALCIUM 9.0   GFR: CrCl cannot be calculated (Unknown ideal weight.). Liver Function Tests: Recent Labs  Lab 03/17/20 1419  AST 12*  ALT 11  ALKPHOS 80  BILITOT 0.5  PROT 7.2  ALBUMIN 3.5   No results for input(s): LIPASE,  AMYLASE in the last 168 hours. No results for input(s): AMMONIA in the last 168 hours. Coagulation Profile: No results for input(s): INR, PROTIME in the last 168 hours. Cardiac Enzymes: No results for input(s): CKTOTAL, CKMB, CKMBINDEX, TROPONINI in the last 168 hours. BNP (last 3 results) No results for input(s): PROBNP in the last 8760 hours. HbA1C: No results for input(s): HGBA1C in the last 72 hours. CBG: No results for input(s): GLUCAP in the last 168 hours. Lipid Profile: No results for input(s): CHOL, HDL, LDLCALC, TRIG, CHOLHDL, LDLDIRECT in the last 72 hours. Thyroid Function Tests: No results for input(s): TSH, T4TOTAL, FREET4, T3FREE, THYROIDAB in the last 72 hours. Anemia Panel: No results for input(s): VITAMINB12, FOLATE, FERRITIN, TIBC, IRON, RETICCTPCT in the last 72 hours. Urine analysis:    Component Value Date/Time   COLORURINE AMBER (A) 01/21/2017 1400   APPEARANCEUR HAZY (A) 01/21/2017 1400   LABSPEC 1.027 01/21/2017 1400   PHURINE 5.0 01/21/2017 1400   GLUCOSEU NEGATIVE 01/21/2017 1400   HGBUR NEGATIVE 01/21/2017 1400   BILIRUBINUR NEGATIVE 01/21/2017 1400   KETONESUR 20 (A) 01/21/2017 1400   PROTEINUR 30 (A) 01/21/2017 1400   NITRITE NEGATIVE 01/21/2017 1400   LEUKOCYTESUR NEGATIVE 01/21/2017 1400    Radiological Exams on Admission: DG Chest 2 View  Result Date: 03/17/2020 CLINICAL DATA:  Shortness of breath. EXAM: CHEST - 2 VIEW COMPARISON:  None. FINDINGS: The heart size and mediastinal contours are within normal limits. Both lungs are clear. The visualized skeletal structures are unremarkable. IMPRESSION: No active cardiopulmonary disease. Electronically Signed   By: Aram Candela M.D.   On: 03/17/2020 19:27    EKG: Independently reviewed.  Assessment/Plan Principal Problem:   Pancytopenia (HCC)    1. Pancytopenia - 1. ? Aplastic anemia vs acute leukemia? 1. Given age: MDS seems unlikely 2. Dr. Truett Perna to see patient and has already  ordered labs for work up 3. Pt getting PRBC transfusion  DVT prophylaxis: SCDs Code Status: Full Family Communication: No family in room Disposition Plan: TBD, needs diagnosis of cause of pancytopenia first, may end up needing transfer to [facility] Consults called: Dr. Truett Perna Admission status: Place in obs    Charlett Merkle M. DO [facility]  How to contact the [facility] Attending or Consulting provider 7A - 7P or covering provider during after hours 7P -7A, for this patient?  1. Check the care team in [facility] and look for a) attending/consulting [facility] provider listed and b) the [facility] team listed 2. Log into [email or web address]  Amion Physician Scheduling and messaging for groups and whole hospitals  On call and physician scheduling software for group practices, residents, hospitalists and other medical providers for call, clinic, rotation and shift schedules. On[employer] is a hospital-wide system for scheduling doctors and paging doctors on call. EasyPlot is for scientific plotting and data analysis.  [email or web address]  and use [facility]'s universal password to access. If you do not have the password, please contact the hospital operator.  3. Locate the  [facility] provider you are looking for under [facility] and page to a number that you can be directly reached. 4. If you still have difficulty reaching the provider, please page the [facility] (Director on Call) for the Hospitalists listed on amion for assistance.  03/17/2020, 9:49 PM

## 2020-03-17 NOTE — ED Notes (Signed)
Running to get her blanket from her car

## 2020-03-17 NOTE — ED Notes (Signed)
Pt to be sent to [facility], dr tegeler reports not to let pt go upstairs till after return phone call from oncology dr Myrle Sheng.

## 2020-03-17 NOTE — ED Notes (Signed)
Patient transported to X-ray 

## 2020-03-17 NOTE — ED Notes (Signed)
Dr tegeler at bedside updating pt on transfer

## 2020-03-17 NOTE — ED Notes (Signed)
Paged Oncology 5 times with no called back

## 2020-03-17 NOTE — ED Provider Notes (Addendum)
[facility] EMERGENCY DEPARTMENT Provider Note   CSN: [id number] Arrival date & time: 03/17/20  1354     History Chief Complaint  Patient presents with  . abnormal labs    Shelby Kerr is a 25 y.o. female.  The history is provided by the patient and medical records. No language interpreter was used.  Shortness of Breath Severity:  Moderate Onset quality:  Gradual Duration:  1 week Timing:  Constant Progression:  Waxing and waning Chronicity:  New Relieved by:  Nothing Worsened by:  Nothing Ineffective treatments:  None tried Associated symptoms: no abdominal pain, no cough, no diaphoresis, no fever, no headaches, no neck pain, no rash, no vomiting and no wheezing   Risk factors: no hx of cancer        Past Medical History:  Diagnosis Date  . Medical history non-contributory     Patient Active Problem List   Diagnosis Date Noted  . Pregnancy 10/06/2019  . Indication for care in labor or delivery 08/19/2017    Past Surgical History:  Procedure Laterality Date  . NO PAST SURGERIES       OB History    Gravida  4   Para  2   Term  2   Preterm      AB  2   Living  2     SAB  1   IAB  1   Ectopic      Multiple  0   Live Births  2           Family History  Problem Relation Age of Onset  . Hypertension Mother     Social History   Tobacco Use  . Smoking status: Never Smoker  . Smokeless tobacco: Never Used  Substance Use Topics  . Alcohol use: No  . Drug use: No    Home Medications Prior to Admission medications   Medication Sig Start Date End Date Taking? Authorizing Provider  Prenatal Vit-Fe Fumarate-FA (PRENATAL MULTIVITAMIN) TABS tablet Take 1 tablet by mouth daily at 12 noon.    [provider]    Allergies    Patient has no known allergies.  Review of Systems   Review of Systems  Constitutional: Positive for fatigue. Negative for chills, diaphoresis and fever.  HENT: Negative for  congestion.   Respiratory: Positive for chest tightness and shortness of breath. Negative for cough, choking and wheezing.   Cardiovascular: Negative for palpitations and leg swelling.  Gastrointestinal: Negative for abdominal pain, constipation, diarrhea, nausea and vomiting.  Genitourinary: Negative for dysuria.  Musculoskeletal: Negative for back pain, neck pain and neck stiffness.  Skin: Negative for rash and wound.  Neurological: Negative for dizziness, light-headedness, numbness and headaches.  Psychiatric/Behavioral: Negative for agitation and confusion.  All other systems reviewed and are negative.   Physical Exam Updated Vital Signs BP 113/78 (BP Location: Left Arm)   Pulse 90   Temp 98.5 F (36.9 C)   Resp 18   LMP 03/09/2020   SpO2 100%   Physical Exam Vitals and nursing note reviewed.  Constitutional:      General: She is not in acute distress.    Appearance: She is well-developed. She is ill-appearing. She is not toxic-appearing or diaphoretic.  HENT:     Head: Normocephalic and atraumatic.     Nose: Nose normal. No rhinorrhea.     Mouth/Throat:     Mouth: Mucous membranes are dry.     Pharynx: No oropharyngeal exudate or posterior  oropharyngeal erythema.  Eyes:     Extraocular Movements: Extraocular movements intact.     Conjunctiva/sclera: Conjunctivae normal.     Pupils: Pupils are equal, round, and reactive to light.  Cardiovascular:     Rate and Rhythm: Normal rate and regular rhythm.     Pulses: Normal pulses.     Heart sounds: No murmur heard.   Pulmonary:     Effort: Pulmonary effort is normal. No respiratory distress.     Breath sounds: Normal breath sounds. No wheezing, rhonchi or rales.  Chest:     Chest wall: No tenderness.  Abdominal:     General: Abdomen is flat.     Palpations: Abdomen is soft.     Tenderness: There is no abdominal tenderness. There is no right CVA tenderness or left CVA tenderness.  Musculoskeletal:        General: No  tenderness.     Cervical back: Neck supple. No tenderness.     Right lower leg: No edema.     Left lower leg: No edema.  Skin:    General: Skin is warm and dry.     Capillary Refill: Capillary refill takes less than 2 seconds.     Coloration: Skin is pale. Skin is not jaundiced.     Findings: No erythema or rash.  Neurological:     General: No focal deficit present.     Mental Status: She is alert.     Sensory: No sensory deficit.     Motor: No weakness.  Psychiatric:        Mood and Affect: Mood normal.     ED Results / Procedures / Treatments   Labs (all labs ordered are listed, but only abnormal results are displayed) Labs Reviewed  CBC - Abnormal; Notable for the following components:      Result Value   WBC 1.7 (*)    RBC 1.80 (*)    Hemoglobin 6.7 (*)    HCT 20.2 (*)    MCV 112.2 (*)    MCH 37.2 (*)    Platelets 51 (*)    All other components within normal limits  COMPREHENSIVE METABOLIC PANEL - Abnormal; Notable for the following components:   AST 12 (*)    All other components within normal limits  CBC WITH DIFFERENTIAL/PLATELET - Abnormal; Notable for the following components:   WBC 2.2 (*)    RBC 2.09 (*)    Hemoglobin 7.4 (*)    HCT 22.5 (*)    MCV 107.7 (*)    MCH 35.4 (*)    RDW 17.2 (*)    Platelets 49 (*)    Neutro Abs 0.0 (*)    All other components within normal limits  RETICULOCYTES - Abnormal; Notable for the following components:   RBC. 2.06 (*)    Immature Retic Fract 29.2 (*)    All other components within normal limits  VITAMIN B12 - Abnormal; Notable for the following components:   Vitamin B-12 150 (*)    All other components within normal limits  LACTATE DEHYDROGENASE  SAVE SMEAR (SSMR)  CBC  TYPE AND SCREEN  PREPARE RBC (CROSSMATCH)  TROPONIN I (HIGH SENSITIVITY)  TROPONIN I (HIGH SENSITIVITY)    EKG EKG Interpretation  Date/Time:  Thursday March 17 2020 19:43:00 EDT Ventricular Rate:  95 PR Interval:    QRS  Duration: 86 QT Interval:  341 QTC Calculation: 429 R Axis:   74 Text Interpretation: Sinus rhythm No prior ECG for comparison , No  STEMI Confirmed by Theda Belfastegeler, Chris ([id number]) on 03/17/2020 8:49:58 PM   Radiology DG Chest 2 View  Result Date: 03/17/2020 CLINICAL DATA:  Shortness of breath. EXAM: CHEST - 2 VIEW COMPARISON:  None. FINDINGS: The heart size and mediastinal contours are within normal limits. Both lungs are clear. The visualized skeletal structures are unremarkable. IMPRESSION: No active cardiopulmonary disease. Electronically Signed   By: Aram Candelahaddeus  Houston M.D.   On: 03/17/2020 19:27    Procedures Procedures   CRITICAL CARE Performed by: Canary Brimhristopher J Skyler Dusing Total critical care time: 35 minutes Critical care time was exclusive of separately billable procedures and treating other patients. Critical care was necessary to treat or prevent imminent or life-threatening deterioration. Critical care was time spent personally by me on the following activities: development of treatment plan with patient and/or surrogate as well as nursing, discussions with consultants, evaluation of patient's response to treatment, examination of patient, obtaining history from patient or surrogate, ordering and performing treatments and interventions, ordering and review of laboratory studies, ordering and review of radiographic studies, pulse oximetry and re-evaluation of patient's condition.   Medications Ordered in ED Medications  acetaminophen (TYLENOL) tablet 650 mg (has no administration in time range)    Or  acetaminophen (TYLENOL) suppository 650 mg (has no administration in time range)  ondansetron (ZOFRAN) tablet 4 mg (has no administration in time range)    Or  ondansetron (ZOFRAN) injection 4 mg (has no administration in time range)  0.9 %  sodium chloride infusion (Manually program via Guardrails IV Fluids) ( Intravenous Stopped 03/17/20 2224)    ED Course  I have reviewed the triage  vital signs and the nursing notes.  Pertinent labs & imaging results that were available during my care of the patient were reviewed by me and considered in my medical decision making (see chart for details).    MDM Rules/Calculators/A&P                          Shelby Kerr is a 25 y.o. female with no significant past medical history who presents at the direction of her PCP for further evaluation of lab abnormalities and exertional shortness of breath and fatigue.  Patient reports that over the last few months, she has had some mild fatigue.  She reports it has been worsening and was worse this week.  She reports now she is having difficulty even getting up and down stairs without being completely winded and short of breath.  She reports tightness in her chest but denies any actual chest pain.  She reports she has felt no energy and tired all the time but denies specific headaches.  She denies any focal neurologic deficits.  She does report she has had bleeding with brushing her teeth over the last few weeks and has also had some random bruising that she cannot explain.  She denies any palpitations, nausea, vomiting, constipation, or diarrhea.  Denies any urinary changes.  Denies any blood in her stool.  No history of significant anemia or needing blood transfusions.  She suspected that her fatigue was due to postpartum as she had a new daughter back in October.  She stopped breast-feeding last month.  Patient went to her PCP where she was found to have pancytopenia and was sent to the emergency cardiomegaly for further evaluation.  On arrival, patient has reassuring vital signs.  She is not hypotensive or tachycardic.  On exam, lungs are clear and chest is nontender.  Abdomen is nontender.  No bleeding seen in her oropharynx.  No focal neurologic deficits on visual exam however when she sat up for lung auscultation, she felt very fatigued and lightheaded.  She does have pale mucous  membranes.  Patient had blood work while waiting in triage and was found to have worsened pancytopenia.  Her paperwork she brought with her shows that her hemoglobin was 6.92 days ago and is now 6.7.  She also had decrease in her platelets and white blood cell count compared to 2 days ago.  She denies any history to her knowledge or any other family history of these lab abnormalities.  We will order blood for symptomatic anemia and will get further work-up for the exertional chest tightness and shortness of breath.  Will call oncology as I am concerned that her pancytopenia could be oncologic in etiology.  Will discuss what work-up they would recommend and admission.  Anticipate admission for symptomatic anemia and pancytopenia of unknown etiology.  9:16 PM Just spoke to oncology.  They recommended getting a B12, LDH, smear, differential, and they are coming to evaluate her in the emergency department.  They recommended giving blood for the symptomatic anemia and agreed with admission to medicine.  If the smear is concerning for acute leukemia, they will likely recommend transfer but will not start this as plan is to admit at this time.     Final Clinical Impression(s) / ED Diagnoses Final diagnoses:  Symptomatic anemia    Clinical Impression: 1. Symptomatic anemia     Disposition: Admit  This note was prepared with assistance of Dragon voice recognition software. Occasional wrong-word or sound-a-like substitutions may have occurred due to the inherent limitations of voice recognition software.      Camiya Vinal, Canary Brim, MD 03/17/20 2234   10:49 PM The oncology team just saw the patient and looked at the blood smear.  Unfortunately, he is concerned this could be acute leukemia.  He will call the oncology team at [facility] and discuss transfer or admission there tonight.  Will await his call to get an accepting physician for admission and transfer at [facility].      Ching Rabideau, Canary Brim, MD 03/17/20 2250

## 2020-03-17 NOTE — ED Triage Notes (Addendum)
Pt states she is here today due to weakness and feeling sluggish.Pt states she went to her PCP and had abnormal labs and they sent her here due labs.Pt hemo , wbc both low.Pt denies any sob or cp

## 2020-03-17 NOTE — ED Notes (Signed)
md at bedside

## 2020-03-17 NOTE — ED Notes (Signed)
Called pts name 3x for updated VS with no response.  

## 2020-03-18 DIAGNOSIS — R0602 Shortness of breath: Secondary | ICD-10-CM | POA: Diagnosis present

## 2020-03-18 DIAGNOSIS — D649 Anemia, unspecified: Secondary | ICD-10-CM | POA: Diagnosis not present

## 2020-03-18 DIAGNOSIS — Z20822 Contact with and (suspected) exposure to covid-19: Secondary | ICD-10-CM | POA: Diagnosis not present

## 2020-03-18 LAB — SARS CORONAVIRUS 2 (TAT 6-24 HRS): SARS Coronavirus 2: NEGATIVE

## 2020-03-18 LAB — DIC (DISSEMINATED INTRAVASCULAR COAGULATION)PANEL
D-Dimer, Quant: 2.57 ug/mL-FEU — ABNORMAL HIGH (ref 0.00–0.50)
Fibrinogen: 459 mg/dL (ref 210–475)
INR: 1.1 (ref 0.8–1.2)
Platelets: 50 10*3/uL — ABNORMAL LOW (ref 150–400)
Prothrombin Time: 13.9 seconds (ref 11.4–15.2)
Smear Review: NONE SEEN
aPTT: 31 seconds (ref 24–36)

## 2020-03-18 LAB — TYPE AND SCREEN
ABO/RH(D): O POS
Antibody Screen: NEGATIVE
Unit division: 0

## 2020-03-18 LAB — BPAM RBC
Blood Product Expiration Date: 202203242359
ISSUE DATE / TIME: 202203172003
Unit Type and Rh: 5100

## 2020-03-18 LAB — URIC ACID: Uric Acid, Serum: 3 mg/dL (ref 2.5–7.1)

## 2020-03-18 LAB — SAVE SMEAR(SSMR), FOR PROVIDER SLIDE REVIEW

## 2020-03-18 NOTE — ED Notes (Signed)
Pt provided meal tray

## 2020-03-18 NOTE — ED Notes (Signed)
Called [facility] for pt to be transported to [facility], also called x-ray to push images over to accepting facility.

## 2022-02-23 IMAGING — CR DG CHEST 2V
2 series · 2 of 2 positions shown · non-contrast
Comparison: None.

CLINICAL DATA: Shortness of breath.

EXAM:
CHEST - 2 VIEW

[chest pa]
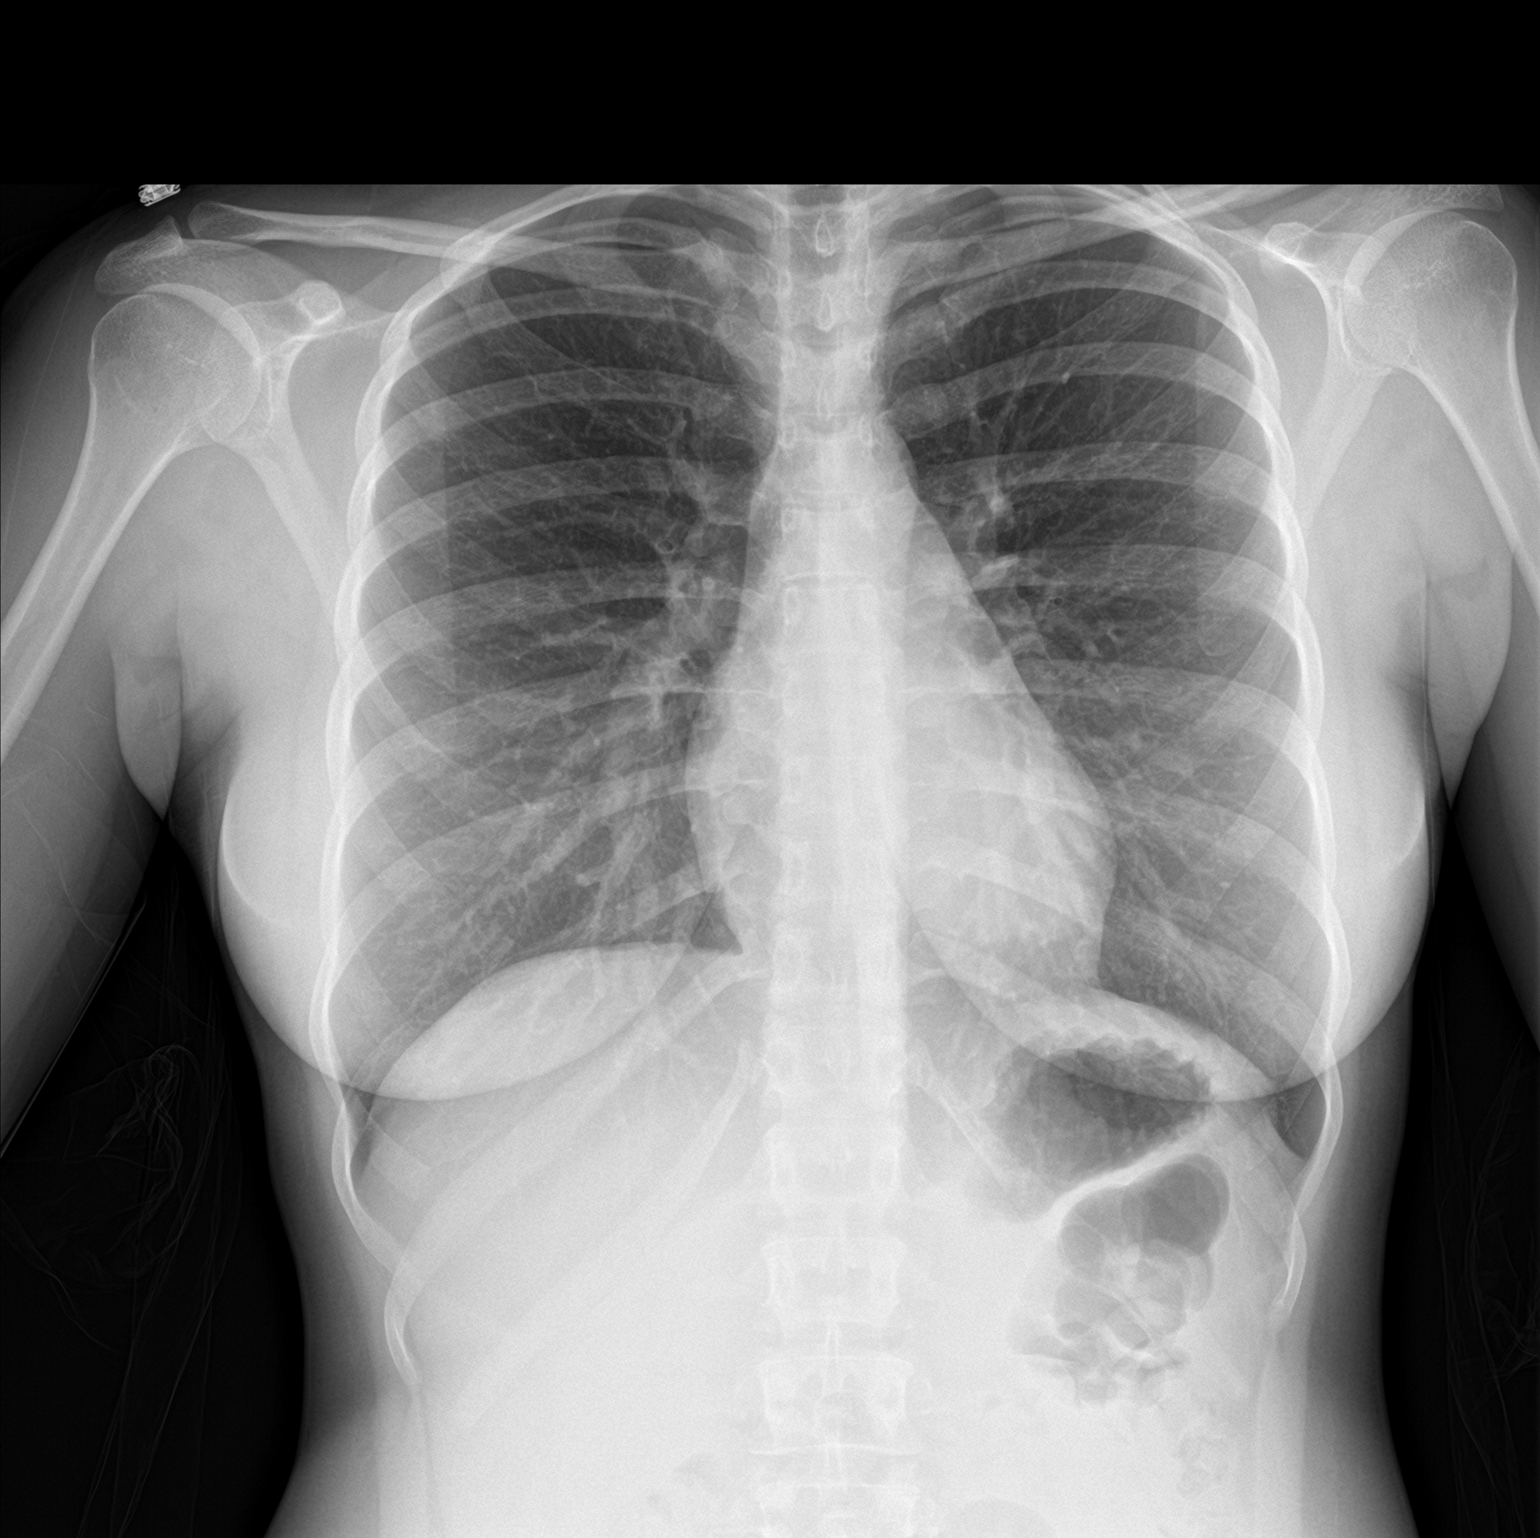

[chest lat]
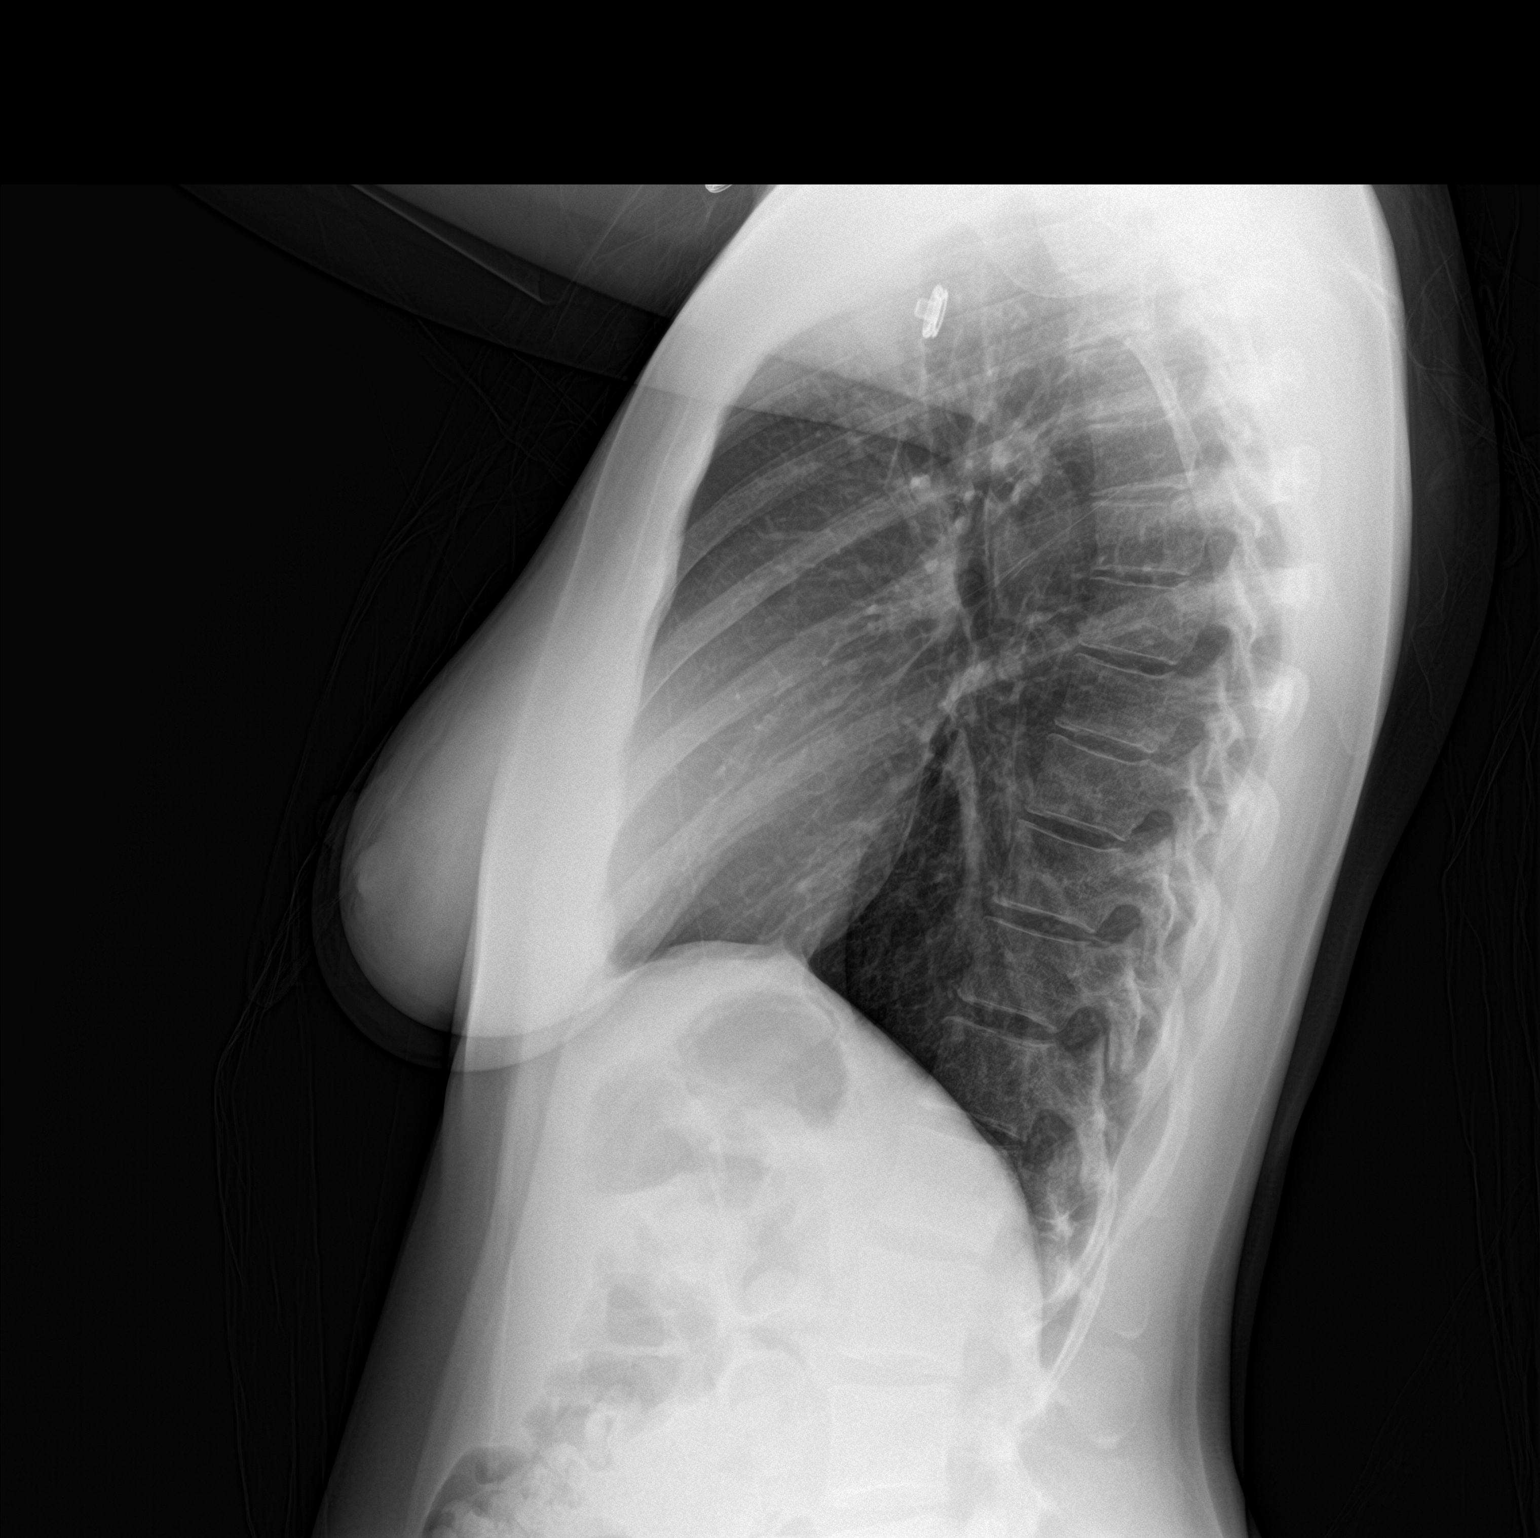

[2 of 2 positions shown; findings below may reference images not displayed]

FINDINGS: The heart size and mediastinal contours are within normal limits.
Both lungs are clear. The visualized skeletal structures are
unremarkable.
IMPRESSION: No active cardiopulmonary disease.

## 2023-01-08 ENCOUNTER — Encounter (HOSPITAL_COMMUNITY): Payer: Self-pay

## 2023-01-08 ENCOUNTER — Emergency Department (HOSPITAL_COMMUNITY)
Admission: EM | Admit: 2023-01-08 | Discharge: 2023-01-08 | Discharge status: Against Medical Advice | Payer: Medicaid Other | Attending: Emergency Medicine | Admitting: Emergency Medicine

## 2023-01-08 ENCOUNTER — Emergency Department (HOSPITAL_COMMUNITY): Payer: Medicaid Other

## 2023-01-08 DIAGNOSIS — Z5321 Procedure and treatment not carried out due to patient leaving prior to being seen by health care provider: Secondary | ICD-10-CM | POA: Insufficient documentation

## 2023-01-08 DIAGNOSIS — R531 Weakness: Secondary | ICD-10-CM | POA: Diagnosis not present

## 2023-01-08 DIAGNOSIS — R509 Fever, unspecified: Secondary | ICD-10-CM | POA: Insufficient documentation

## 2023-01-08 LAB — CBC WITH DIFFERENTIAL/PLATELET
Abs Immature Granulocytes: 0.05 10*3/uL (ref 0.00–0.07)
Basophils Absolute: 0 10*3/uL (ref 0.0–0.1)
Basophils Relative: 0 %
Eosinophils Absolute: 0 10*3/uL (ref 0.0–0.5)
Eosinophils Relative: 0 %
HCT: 39.3 % (ref 36.0–46.0)
Hemoglobin: 12.7 g/dL (ref 12.0–15.0)
Immature Granulocytes: 0 %
Lymphocytes Relative: 23 %
Lymphs Abs: 2.9 10*3/uL (ref 0.7–4.0)
MCH: 30 pg (ref 26.0–34.0)
MCHC: 32.3 g/dL (ref 30.0–36.0)
MCV: 92.9 fL (ref 80.0–100.0)
Monocytes Absolute: 2.3 10*3/uL — ABNORMAL HIGH (ref 0.1–1.0)
Monocytes Relative: 18 %
Neutro Abs: 7.3 10*3/uL (ref 1.7–7.7)
Neutrophils Relative %: 59 %
Platelets: 277 10*3/uL (ref 150–400)
RBC: 4.23 MIL/uL (ref 3.87–5.11)
RDW: 13.2 % (ref 11.5–15.5)
WBC: 12.6 10*3/uL — ABNORMAL HIGH (ref 4.0–10.5)
nRBC: 0 % (ref 0.0–0.2)

## 2023-01-08 LAB — BASIC METABOLIC PANEL
Anion gap: 9 (ref 5–15)
BUN: 10 mg/dL (ref 6–20)
CO2: 26 mmol/L (ref 22–32)
Calcium: 9.2 mg/dL (ref 8.9–10.3)
Chloride: 100 mmol/L (ref 98–111)
Creatinine, Ser: 0.62 mg/dL (ref 0.44–1.00)
GFR, Estimated: >60 mL/min (ref >60)
Glucose, Bld: 99 mg/dL (ref 70–99)
Potassium: 3.5 mmol/L (ref 3.5–5.1)
Sodium: 135 mmol/L (ref 135–145)

## 2023-01-08 NOTE — ED Provider Triage Note (Signed)
 Emergency Medicine Provider Triage Evaluation Note  Shelby Kerr , a 28 y.o. female  was evaluated in triage.  Pt complains of flulike symptoms for the past 3 days.  Patient went to urgent care yesterday was diagnosed with the flu and given Tamiflu, dicyclomine, cough medicine but states that her temp is fluctuated tween 99 F and 102 F, recommend she comes in.  Patient does endorse decreased fluid intake but denies chest pain, shortness of breath.  Patient is coughing up yellow sputum.  Patient denies vomiting.  Patient endorses generalized weakness from the flu.  Review of Systems  Positive:  Negative:   Physical Exam  BP 107/77 (BP Location: Left Arm)   Pulse (!) 123   Temp 99.7 F (37.6 C) (Oral)   Resp 18   SpO2 98%  Gen:   Awake, no distress   Resp:  Normal effort  MSK:   Moves extremities without difficulty  Other:  Tachycardic without murmur, lungs clear to auscultation bilaterally, nondiaphoretic  Medical Decision Making  Medically screening exam initiated at 3:04 PM.  Appropriate orders placed.  Shelby Kerr was informed that the remainder of the evaluation will be completed by another provider, this initial triage assessment does not replace that evaluation, and the importance of remaining in the ED until their evaluation is complete.  Workup initiated, will obtain basic labs and chest x-ray as patient is tachycardic at 129 while in the room.  Patient showed paperwork for testing positive for the flu and so will not reorder swab.  Patient is not febrile here.  Do suspect tachycardia is from decreased fluid intake.  Patient stable at this time.   Shelby Lynwood DASEN, PA-C 01/08/23 1506

## 2023-01-08 NOTE — ED Triage Notes (Signed)
 Pt reports she was diagnosed with the flu yesterday and the fevers are not going away with Tylenol.
